# Patient Record
Sex: Female | Born: 1965 | Race: Black or African American | Hispanic: No | Marital: Single | Smoking: Never smoker
Health system: Southern US, Community
[De-identification: ages and names within clinical notes are randomized; demographics above are authoritative.]

## PROBLEM LIST (undated history)

## (undated) DIAGNOSIS — R569 Unspecified convulsions: Secondary | ICD-10-CM

## (undated) DIAGNOSIS — I639 Cerebral infarction, unspecified: Secondary | ICD-10-CM

## (undated) DIAGNOSIS — I10 Essential (primary) hypertension: Secondary | ICD-10-CM

## (undated) HISTORY — DX: Unspecified convulsions: R56.9

---

## 2017-06-13 ENCOUNTER — Inpatient Hospital Stay (HOSPITAL_COMMUNITY)
Admission: EM | Admit: 2017-06-13 | Discharge: 2017-06-17 | DRG: 065 | Disposition: A | Discharge status: Rehabilitation Facility | Payer: Medicare Other | Attending: Internal Medicine | Admitting: Internal Medicine

## 2017-06-13 ENCOUNTER — Emergency Department (HOSPITAL_COMMUNITY): Payer: Medicare Other

## 2017-06-13 ENCOUNTER — Inpatient Hospital Stay (HOSPITAL_COMMUNITY): Payer: Medicare Other

## 2017-06-13 ENCOUNTER — Encounter (HOSPITAL_COMMUNITY): Payer: Self-pay | Admitting: Emergency Medicine

## 2017-06-13 DIAGNOSIS — Z885 Allergy status to narcotic agent status: Secondary | ICD-10-CM | POA: Diagnosis not present

## 2017-06-13 DIAGNOSIS — I69359 Hemiplegia and hemiparesis following cerebral infarction affecting unspecified side: Secondary | ICD-10-CM

## 2017-06-13 DIAGNOSIS — I160 Hypertensive urgency: Secondary | ICD-10-CM | POA: Diagnosis present

## 2017-06-13 DIAGNOSIS — E785 Hyperlipidemia, unspecified: Secondary | ICD-10-CM | POA: Diagnosis present

## 2017-06-13 DIAGNOSIS — I639 Cerebral infarction, unspecified: Secondary | ICD-10-CM | POA: Diagnosis not present

## 2017-06-13 DIAGNOSIS — I69351 Hemiplegia and hemiparesis following cerebral infarction affecting right dominant side: Secondary | ICD-10-CM | POA: Diagnosis present

## 2017-06-13 DIAGNOSIS — G8929 Other chronic pain: Secondary | ICD-10-CM | POA: Diagnosis not present

## 2017-06-13 DIAGNOSIS — R29898 Other symptoms and signs involving the musculoskeletal system: Secondary | ICD-10-CM | POA: Diagnosis present

## 2017-06-13 DIAGNOSIS — R29810 Facial weakness: Secondary | ICD-10-CM | POA: Diagnosis present

## 2017-06-13 DIAGNOSIS — R000 Tachycardia, unspecified: Secondary | ICD-10-CM | POA: Diagnosis not present

## 2017-06-13 DIAGNOSIS — Z791 Long term (current) use of non-steroidal anti-inflammatories (NSAID): Secondary | ICD-10-CM | POA: Diagnosis not present

## 2017-06-13 DIAGNOSIS — M4316 Spondylolisthesis, lumbar region: Secondary | ICD-10-CM | POA: Diagnosis present

## 2017-06-13 DIAGNOSIS — M5117 Intervertebral disc disorders with radiculopathy, lumbosacral region: Secondary | ICD-10-CM | POA: Diagnosis present

## 2017-06-13 DIAGNOSIS — Z8673 Personal history of transient ischemic attack (TIA), and cerebral infarction without residual deficits: Secondary | ICD-10-CM | POA: Diagnosis not present

## 2017-06-13 DIAGNOSIS — I63431 Cerebral infarction due to embolism of right posterior cerebral artery: Secondary | ICD-10-CM | POA: Diagnosis not present

## 2017-06-13 DIAGNOSIS — R29705 NIHSS score 5: Secondary | ICD-10-CM | POA: Diagnosis present

## 2017-06-13 DIAGNOSIS — E669 Obesity, unspecified: Secondary | ICD-10-CM | POA: Diagnosis present

## 2017-06-13 DIAGNOSIS — M5126 Other intervertebral disc displacement, lumbar region: Secondary | ICD-10-CM | POA: Diagnosis present

## 2017-06-13 DIAGNOSIS — I10 Essential (primary) hypertension: Secondary | ICD-10-CM | POA: Diagnosis present

## 2017-06-13 DIAGNOSIS — K5900 Constipation, unspecified: Secondary | ICD-10-CM

## 2017-06-13 DIAGNOSIS — J810 Acute pulmonary edema: Secondary | ICD-10-CM | POA: Diagnosis not present

## 2017-06-13 DIAGNOSIS — M4727 Other spondylosis with radiculopathy, lumbosacral region: Secondary | ICD-10-CM | POA: Diagnosis present

## 2017-06-13 DIAGNOSIS — R471 Dysarthria and anarthria: Secondary | ICD-10-CM | POA: Diagnosis present

## 2017-06-13 DIAGNOSIS — Z888 Allergy status to other drugs, medicaments and biological substances status: Secondary | ICD-10-CM | POA: Diagnosis not present

## 2017-06-13 DIAGNOSIS — D259 Leiomyoma of uterus, unspecified: Secondary | ICD-10-CM | POA: Diagnosis present

## 2017-06-13 DIAGNOSIS — I63512 Cerebral infarction due to unspecified occlusion or stenosis of left middle cerebral artery: Secondary | ICD-10-CM | POA: Diagnosis present

## 2017-06-13 DIAGNOSIS — M4802 Spinal stenosis, cervical region: Secondary | ICD-10-CM | POA: Diagnosis present

## 2017-06-13 DIAGNOSIS — I69322 Dysarthria following cerebral infarction: Secondary | ICD-10-CM | POA: Diagnosis not present

## 2017-06-13 DIAGNOSIS — Z7982 Long term (current) use of aspirin: Secondary | ICD-10-CM

## 2017-06-13 DIAGNOSIS — R7303 Prediabetes: Secondary | ICD-10-CM | POA: Diagnosis not present

## 2017-06-13 DIAGNOSIS — G40909 Epilepsy, unspecified, not intractable, without status epilepticus: Secondary | ICD-10-CM | POA: Diagnosis present

## 2017-06-13 DIAGNOSIS — D72829 Elevated white blood cell count, unspecified: Secondary | ICD-10-CM | POA: Diagnosis present

## 2017-06-13 DIAGNOSIS — Z823 Family history of stroke: Secondary | ICD-10-CM

## 2017-06-13 DIAGNOSIS — Z87898 Personal history of other specified conditions: Secondary | ICD-10-CM

## 2017-06-13 DIAGNOSIS — I6523 Occlusion and stenosis of bilateral carotid arteries: Secondary | ICD-10-CM | POA: Diagnosis present

## 2017-06-13 DIAGNOSIS — F4322 Adjustment disorder with anxiety: Secondary | ICD-10-CM | POA: Diagnosis present

## 2017-06-13 DIAGNOSIS — I728 Aneurysm of other specified arteries: Secondary | ICD-10-CM | POA: Diagnosis present

## 2017-06-13 DIAGNOSIS — M48061 Spinal stenosis, lumbar region without neurogenic claudication: Secondary | ICD-10-CM | POA: Diagnosis present

## 2017-06-13 DIAGNOSIS — I739 Peripheral vascular disease, unspecified: Secondary | ICD-10-CM | POA: Diagnosis present

## 2017-06-13 DIAGNOSIS — I119 Hypertensive heart disease without heart failure: Secondary | ICD-10-CM | POA: Diagnosis present

## 2017-06-13 DIAGNOSIS — J811 Chronic pulmonary edema: Secondary | ICD-10-CM | POA: Diagnosis present

## 2017-06-13 DIAGNOSIS — Z79899 Other long term (current) drug therapy: Secondary | ICD-10-CM

## 2017-06-13 DIAGNOSIS — M5441 Lumbago with sciatica, right side: Secondary | ICD-10-CM | POA: Diagnosis present

## 2017-06-13 DIAGNOSIS — I69311 Memory deficit following cerebral infarction: Secondary | ICD-10-CM | POA: Diagnosis not present

## 2017-06-13 DIAGNOSIS — F5104 Psychophysiologic insomnia: Secondary | ICD-10-CM | POA: Diagnosis present

## 2017-06-13 DIAGNOSIS — M5127 Other intervertebral disc displacement, lumbosacral region: Secondary | ICD-10-CM | POA: Diagnosis present

## 2017-06-13 DIAGNOSIS — R6510 Systemic inflammatory response syndrome (SIRS) of non-infectious origin without acute organ dysfunction: Secondary | ICD-10-CM | POA: Diagnosis not present

## 2017-06-13 DIAGNOSIS — I700 Atherosclerosis of aorta: Secondary | ICD-10-CM | POA: Diagnosis present

## 2017-06-13 DIAGNOSIS — Z6834 Body mass index (BMI) 34.0-34.9, adult: Secondary | ICD-10-CM

## 2017-06-13 DIAGNOSIS — R569 Unspecified convulsions: Secondary | ICD-10-CM | POA: Diagnosis not present

## 2017-06-13 DIAGNOSIS — M5440 Lumbago with sciatica, unspecified side: Secondary | ICD-10-CM | POA: Diagnosis not present

## 2017-06-13 DIAGNOSIS — M79652 Pain in left thigh: Secondary | ICD-10-CM | POA: Diagnosis not present

## 2017-06-13 DIAGNOSIS — F5101 Primary insomnia: Secondary | ICD-10-CM | POA: Diagnosis not present

## 2017-06-13 DIAGNOSIS — M5416 Radiculopathy, lumbar region: Secondary | ICD-10-CM | POA: Diagnosis not present

## 2017-06-13 HISTORY — DX: Cerebral infarction, unspecified: I63.9

## 2017-06-13 HISTORY — DX: Essential (primary) hypertension: I10

## 2017-06-13 LAB — I-STAT TROPONIN, ED: TROPONIN I, POC: 0.01 ng/mL (ref 0.00–0.08)

## 2017-06-13 LAB — DIFFERENTIAL
Basophils Absolute: 0 10*3/uL (ref 0.0–0.1)
Basophils Relative: 0 %
Eosinophils Absolute: 0.3 10*3/uL (ref 0.0–0.7)
Eosinophils Relative: 2 %
LYMPHS ABS: 3.1 10*3/uL (ref 0.7–4.0)
Lymphocytes Relative: 25 %
MONOS PCT: 5 %
Monocytes Absolute: 0.6 10*3/uL (ref 0.1–1.0)
Neutro Abs: 8.2 10*3/uL — ABNORMAL HIGH (ref 1.7–7.7)
Neutrophils Relative %: 68 %

## 2017-06-13 LAB — URINALYSIS, ROUTINE W REFLEX MICROSCOPIC
Bacteria, UA: NONE SEEN
Bilirubin Urine: NEGATIVE
GLUCOSE, UA: NEGATIVE mg/dL
Ketones, ur: NEGATIVE mg/dL
Leukocytes, UA: NEGATIVE
Nitrite: NEGATIVE
PH: 5 (ref 5.0–8.0)
PROTEIN: NEGATIVE mg/dL
Specific Gravity, Urine: 1.008 (ref 1.005–1.030)

## 2017-06-13 LAB — RAPID URINE DRUG SCREEN, HOSP PERFORMED
Amphetamines: NOT DETECTED
BARBITURATES: NOT DETECTED
BENZODIAZEPINES: NOT DETECTED
Cocaine: NOT DETECTED
Opiates: NOT DETECTED
Tetrahydrocannabinol: NOT DETECTED

## 2017-06-13 LAB — COMPREHENSIVE METABOLIC PANEL
ALT: 38 U/L (ref 14–54)
AST: 31 U/L (ref 15–41)
Albumin: 3.9 g/dL (ref 3.5–5.0)
Alkaline Phosphatase: 61 U/L (ref 38–126)
Anion gap: 10 (ref 5–15)
BILIRUBIN TOTAL: 0.4 mg/dL (ref 0.3–1.2)
BUN: 9 mg/dL (ref 6–20)
CHLORIDE: 106 mmol/L (ref 101–111)
CO2: 23 mmol/L (ref 22–32)
CREATININE: 0.62 mg/dL (ref 0.44–1.00)
Calcium: 10.2 mg/dL (ref 8.9–10.3)
GFR calc Af Amer: >60 mL/min (ref >60)
Glucose, Bld: 101 mg/dL — ABNORMAL HIGH (ref 65–99)
Potassium: 4 mmol/L (ref 3.5–5.1)
Sodium: 139 mmol/L (ref 135–145)
TOTAL PROTEIN: 7.6 g/dL (ref 6.5–8.1)

## 2017-06-13 LAB — CBC
HEMATOCRIT: 40.7 % (ref 36.0–46.0)
Hemoglobin: 13 g/dL (ref 12.0–15.0)
MCH: 28.8 pg (ref 26.0–34.0)
MCHC: 31.9 g/dL (ref 30.0–36.0)
MCV: 90 fL (ref 78.0–100.0)
PLATELETS: 303 10*3/uL (ref 150–400)
RBC: 4.52 MIL/uL (ref 3.87–5.11)
RDW: 14.7 % (ref 11.5–15.5)
WBC: 12.2 10*3/uL — ABNORMAL HIGH (ref 4.0–10.5)

## 2017-06-13 LAB — ETHANOL

## 2017-06-13 LAB — PROTIME-INR
INR: 0.94
PROTHROMBIN TIME: 12.5 s (ref 11.4–15.2)

## 2017-06-13 LAB — APTT: aPTT: 29 seconds (ref 24–36)

## 2017-06-13 MED ORDER — ASPIRIN 325 MG PO TABS
325.0000 mg | ORAL_TABLET | Freq: Every day | ORAL | Status: DC
  Administered 2017-06-14: 325 mg via ORAL
  Filled 2017-06-13: qty 1

## 2017-06-13 MED ORDER — HYDROCODONE-ACETAMINOPHEN 5-325 MG PO TABS
1.0000 | ORAL_TABLET | Freq: Four times a day (QID) | ORAL | Status: DC | PRN
  Administered 2017-06-14: 1 via ORAL
  Administered 2017-06-14 – 2017-06-17 (×5): 2 via ORAL
  Filled 2017-06-13: qty 1
  Filled 2017-06-13 (×5): qty 2

## 2017-06-13 MED ORDER — DOCUSATE SODIUM 100 MG PO CAPS
100.0000 mg | ORAL_CAPSULE | Freq: Every day | ORAL | Status: DC
  Administered 2017-06-14 – 2017-06-17 (×4): 100 mg via ORAL
  Filled 2017-06-13 (×4): qty 1

## 2017-06-13 MED ORDER — HYDROCODONE-ACETAMINOPHEN 5-325 MG PO TABS: 1.0000 | ORAL_TABLET | Freq: Four times a day (QID) | ORAL | Status: DC | PRN

## 2017-06-13 MED ORDER — SENNOSIDES-DOCUSATE SODIUM 8.6-50 MG PO TABS
1.0000 | ORAL_TABLET | Freq: Every evening | ORAL | Status: DC | PRN
  Administered 2017-06-14 – 2017-06-16 (×2): 1 via ORAL
  Filled 2017-06-13 (×2): qty 1

## 2017-06-13 MED ORDER — ASPIRIN 300 MG RE SUPP: 300.0000 mg | Freq: Every day | RECTAL | Status: DC

## 2017-06-13 MED ORDER — LAMOTRIGINE 100 MG PO TABS
200.0000 mg | ORAL_TABLET | Freq: Every day | ORAL | Status: DC
  Administered 2017-06-14 – 2017-06-17 (×3): 200 mg via ORAL
  Filled 2017-06-13: qty 1
  Filled 2017-06-13 (×4): qty 2

## 2017-06-13 MED ORDER — IOPAMIDOL (ISOVUE-370) INJECTION 76%
INTRAVENOUS | Status: AC
  Administered 2017-06-14: 50 mL
  Filled 2017-06-13: qty 50

## 2017-06-13 MED ORDER — ASPIRIN EC 325 MG PO TBEC
325.0000 mg | DELAYED_RELEASE_TABLET | Freq: Every day | ORAL | Status: DC
  Administered 2017-06-13: 325 mg via ORAL
  Filled 2017-06-13: qty 1

## 2017-06-13 MED ORDER — ATORVASTATIN CALCIUM 40 MG PO TABS: 40.0000 mg | ORAL_TABLET | Freq: Every day | ORAL | Status: DC

## 2017-06-13 MED ORDER — STROKE: EARLY STAGES OF RECOVERY BOOK
Freq: Once | Status: AC
  Administered 2017-06-14: 01:00:00
  Filled 2017-06-13 (×2): qty 1

## 2017-06-13 MED ORDER — HYDROCODONE-ACETAMINOPHEN 5-325 MG PO TABS
1.0000 | ORAL_TABLET | Freq: Once | ORAL | Status: AC
  Administered 2017-06-13: 1 via ORAL
  Filled 2017-06-13: qty 1

## 2017-06-13 MED ORDER — VITAMIN B-12 1000 MCG PO TABS
1000.0000 ug | ORAL_TABLET | Freq: Every day | ORAL | Status: DC
  Administered 2017-06-14 – 2017-06-17 (×4): 1000 ug via ORAL
  Filled 2017-06-13 (×4): qty 1

## 2017-06-13 MED ORDER — ACETAMINOPHEN 325 MG PO TABS: 650.0000 mg | ORAL_TABLET | ORAL | Status: DC | PRN

## 2017-06-13 MED ORDER — MORPHINE SULFATE (PF) 4 MG/ML IV SOLN
2.0000 mg | INTRAVENOUS | Status: DC | PRN
  Administered 2017-06-13: 2 mg via INTRAVENOUS
  Filled 2017-06-13: qty 1

## 2017-06-13 MED ORDER — ACETAMINOPHEN 160 MG/5ML PO SOLN: 650.0000 mg | ORAL | Status: DC | PRN

## 2017-06-13 MED ORDER — ZOLPIDEM TARTRATE 5 MG PO TABS
5.0000 mg | ORAL_TABLET | Freq: Every day | ORAL | Status: DC
  Administered 2017-06-14 – 2017-06-16 (×4): 5 mg via ORAL
  Filled 2017-06-13 (×4): qty 1

## 2017-06-13 MED ORDER — ACETAMINOPHEN 650 MG RE SUPP: 650.0000 mg | RECTAL | Status: DC | PRN

## 2017-06-13 MED ORDER — ENOXAPARIN SODIUM 40 MG/0.4ML ~~LOC~~ SOLN
40.0000 mg | Freq: Every day | SUBCUTANEOUS | Status: DC
  Administered 2017-06-14 – 2017-06-17 (×4): 40 mg via SUBCUTANEOUS
  Filled 2017-06-13 (×4): qty 0.4

## 2017-06-13 MED ORDER — SODIUM CHLORIDE 0.9 % IV SOLN
INTRAVENOUS | Status: AC
  Administered 2017-06-13: 22:00:00 via INTRAVENOUS

## 2017-06-13 MED ORDER — FERROUS SULFATE 325 (65 FE) MG PO TABS
325.0000 mg | ORAL_TABLET | Freq: Every day | ORAL | Status: DC
  Administered 2017-06-14 – 2017-06-17 (×4): 325 mg via ORAL
  Filled 2017-06-13 (×4): qty 1

## 2017-06-13 NOTE — Progress Notes (Signed)
Pt admitted from ED with stroke diagnosis, pt alert and oriented, a bit weak, c/o of back and left leg pain, pt settled in bed, call light at bedside, tele monitor put and verified on pt, safety issues addressed accordingly, pt reassured and will continue to monitor. Leslie Simmons, [name]

## 2017-06-13 NOTE — ED Notes (Addendum)
Pt given 2mg  morphine. Pt was sitting on the side of the bed. Immediately after morphine administration pt stated she felt faint. Pt then went unresponsive for approx 10 seconds. This RN along with Joellen Jersey, RN were able to get pt back into bed. Pt slow to arouse, but finally able to answer questions appropriately when stimulated. Pt VS remain stable although pt BP is much lower than her normal. Pt lying flat in bed. Dr Myna Hidalgo made aware.

## 2017-06-13 NOTE — Consult Note (Addendum)
Requesting Physician: Dr. Ronnald Ramp    Chief Complaint:  Left leg weakness  History obtained from:  Patient and Chart   HPI:                                                                                                                                       Leslie Simmons is an 51 y.o. female  with PMH significant for hypertension, prior Left MCA stroke ( ?? Hemorrhagic per patient)  with residual right-sided hemiparesis presents to the emergency room for left leg weakness and back pain that began on 8.29.18 evening. Today she was been dragging her leg and decided to come to the emergency room. MRI L-spine was performed in the ER which did not show any significant nerve root compression or stenosis. An MRI brain showed a right midbrain stroke. She has from [place] visiting family.  Regarding her prior strokes, they occurred in 2012 and 2013. On MRI she has old left MCA stroke likely ischemic with no evidence of chronic hemorrhage. She was treated at [facility] ,[place]. They said that the etiology for stroke was hypertension.    Date last known well: 8.29.18 Time last known well: Morning of 8.29.18 tPA Given: Outside window NIHSS : 2       Modified Rankin: 1   History reviewed. No pertinent past medical history.  History reviewed. No pertinent surgical history.  FAMILY History: 16 year old son had multiple strokes  Social History:  reports that she has never smoked. She has never used smokeless tobacco. She reports that she does not drink alcohol or use drugs.  Allergies:  Allergies  Allergen Reactions  . Gabapentin Swelling  . Codeine Rash    Medications:                                                                                                                           Reviewed medication history. Patient is taking ASA  ROS:  General ROS: negative for - chills, fatigue, fever, night sweats, weight gain or weight loss Psychological ROS: negative for - behavioral disorder, hallucinations, memory difficulties, mood swings or suicidal ideation Ophthalmic ROS: negative for - blurry vision, double vision, eye pain or loss of vision ENT ROS: negative for - epistaxis, nasal discharge, oral lesions, sore throat, tinnitus or vertigo Allergy and Immunology ROS: negative for - hives or itchy/watery eyes Hematological and Lymphatic ROS: negative for - bleeding problems, bruising or swollen lymph nodes Endocrine ROS: negative for - galactorrhea, hair pattern changes, polydipsia/polyuria or temperature intolerance Respiratory ROS: negative for - cough, hemoptysis, shortness of breath or wheezing Cardiovascular ROS: negative for - chest pain, dyspnea on exertion, edema or irregular heartbeat Gastrointestinal ROS: negative for - abdominal pain, diarrhea, hematemesis, nausea/vomiting or stool incontinence Genito-Urinary ROS: negative for - dysuria, hematuria, incontinence or urinary frequency/urgency Musculoskeletal ROS: negative for - joint swelling or muscular weakness Neurological ROS: as noted in HPI Dermatological ROS: negative for rash and skin lesion changes   Examination:                                                                                                      General: Appears well-developed and well-nourished.  Psych: Affect appropriate to situation Eyes: No scleral injection HENT: No OP obstrucion Head: Normocephalic.  Cardiovascular: Normal rate and regular rhythm.  Respiratory: Effort normal and breath sounds normal to anterior ascultation GI: Soft.  No distension. There is no tenderness.  Skin: WDI   Neurological Examination Mental Status: Alert, oriented, thought content appropriate.  Speech fluent without evidence of aphasia.  Able to follow 3 step commands without difficulty. Cranial  Nerves: II: Discs flat bilaterally; Visual fields grossly normal,  III,IV, VI: ptosis not present, extra-ocular motions intact bilaterally, pupils equal, round, reactive to light and accommodation V,VII: smile symmetric, facial light touch sensation normal bilaterally VIII: hearing normal bilaterally IX,X: uvula rises symmetrically XI: bilateral shoulder shrug XII: midline tongue extension Motor: Right : Upper extremity   5/5    Left:     Upper extremity   4+/5  Lower extremity   4+/5    Lower extremity   4/5 Tone and bulk:normal tone throughout; no atrophy noted Sensory:  Reduced sensation over left face, arm and leg Deep Tendon Reflexes: 2+ and symmetric throughout Plantars: Right: downgoing   Left: downgoing Cerebellar: normal finger-to-nose, normal rapid alternating movements and normal heel-to-shin test Gait: normal gait and station     Lab Results: Basic Metabolic Panel:  Recent Labs Lab 06/13/17 1144  NA 139  K 4.0  CL 106  CO2 23  GLUCOSE 101*  BUN 9  CREATININE 0.62  CALCIUM 10.2    CBC:  Recent Labs Lab 06/13/17 1144  WBC 12.2*  NEUTROABS 8.2*  HGB 13.0  HCT 40.7  MCV 90.0  PLT 303    Coagulation Studies:  Recent Labs  06/13/17 1144  LABPROT 12.5  INR 0.94    Imaging: Ct Head Wo Contrast  Result Date: 06/13/2017 CLINICAL DATA:  Bilateral lower extremity weakness for 2 days. History of  stroke with residual right-sided weakness. EXAM: CT HEAD WITHOUT CONTRAST TECHNIQUE: Contiguous axial images were obtained from the base of the skull through the vertex without intravenous contrast. COMPARISON:  None. FINDINGS: Brain: There is a moderate-sized chronic left MCA infarct in the parietal lobe. A chronic lateral lenticulostriate infarct is noted involving the anterior limb of the left internal capsule and basal ganglia. There is slight ex vacuo enlargement of the left lateral ventricle. There is no evidence of acute infarct, intracranial hemorrhage,  mass, midline shift, or extra-axial fluid collection. Vascular: Calcified atherosclerosis at the skullbase. No hyperdense vessel. Skull: No fracture or focal osseous lesion. Sinuses/Orbits: Unremarkable orbits. Partially visualized small osteoma in the left maxillary sinus. Clear mastoid air cells. Other: None. IMPRESSION: 1. No evidence of acute intracranial abnormality. 2. Chronic left parietal and basal ganglia infarcts. Electronically Signed   By: Logan Bores M.D.   On: 06/13/2017 10:47   Mr Brain Wo Contrast (neuro Protocol)  Result Date: 06/13/2017 CLINICAL DATA:  Initial evaluation for acute left leg weakness. EXAM: MRI HEAD WITHOUT CONTRAST TECHNIQUE: Multiplanar, multiecho pulse sequences of the brain and surrounding structures were obtained without intravenous contrast. COMPARISON:  Prior CT from earlier the same day. FINDINGS: Brain: Generalized age appropriate cerebral atrophy. Encephalomalacia with gliosis within the posterior left frontoparietal region compatible with remote left MCA territory infarct. Additional remote lacunar type infarcts present within the left basal ganglia. Approximate 1 cm focus of restricted diffusion at the lateral right midbrain at the level the right cortical spinal tract (series 3, image 24), consistent with an acute ischemic infarct. No associated mass effect or hemorrhage. No other evidence for acute or subacute ischemia. No other areas of chronic infarction. No other acute or chronic intracranial hemorrhage. No mass lesion, midline shift or mass effect. No hydrocephalus. No extra-axial fluid collection. Major dural sinuses are grossly patent. Pituitary gland suprasellar region within normal limits. Vascular: Major intracranial vascular flow voids maintained. Skull and upper cervical spine: Craniocervical junction normal. Mild degenerate spondylolysis noted within the upper cervical spine without significant stenosis. Bone marrow signal intensity within normal  limits. No scalp soft tissue abnormality. Sinuses/Orbits: Globes and orbital soft tissues within normal limits. Mild scattered mucosal thickening within the ethmoidal air cells and maxillary sinuses. Paranasal sinuses are otherwise clear. No mastoid effusion. Inner ear structures normal. IMPRESSION: 1. 1 cm acute ischemic nonhemorrhagic infarct involving the lateral right midbrain at the level the right corticospinal tract. No associated mass effect. 2. Remote posterior left MCA territory and left basal ganglia infarcts as above. Electronically Signed   By: Jeannine Boga M.D.   On: 06/13/2017 18:15   Mr Lumbar Spine Wo Contrast  Result Date: 06/13/2017 CLINICAL DATA:  Initial evaluation for acute back pain, leg weakness. EXAM: MRI LUMBAR SPINE WITHOUT CONTRAST TECHNIQUE: Multiplanar, multisequence MR imaging of the lumbar spine was performed. No intravenous contrast was administered. COMPARISON:  None. FINDINGS: Segmentation: Normal segmentation. Lowest well-formed disc labeled the L5-S1 level. Alignment: 4 mm anterolisthesis of L4 on L5. Trace retrolisthesis of L2 on L3 and L3 on L4. Vertebral bodies otherwise normally aligned with preservation of the normal lumbar lordosis. Vertebrae: Vertebral body heights maintained. No evidence for acute or chronic fracture. Signal intensity within the vertebral body bone marrow within normal limits. No discrete or worrisome osseous lesions. No abnormal marrow edema. Conus medullaris: Extends to the L1 level and appears normal. Paraspinal and other soft tissues: Paraspinous soft tissues demonstrate no acute abnormality. Fibroid uterus noted. Partially visualized visceral structures  otherwise unremarkable. Disc levels: Mild diffuse congenital shortening of the pedicles noted. L1-2:  Unremarkable. L2-3: Diffuse degenerative disc bulge with disc desiccation. Superimposed left foraminal/ extraforaminal disc protrusion, contacting the exiting left L2 nerve root as it  courses of the left neural foramen (series 4, image 11). No significant canal stenosis. Mild bilateral L2 foraminal stenosis related to disc bulge and short pedicles. L3-4: Diffuse disc bulge with disc desiccation and mild intervertebral disc space narrowing. Mild facet and ligamentum flavum hypertrophy. Short pedicles. Resultant moderate canal and bilateral subarticular stenosis. Mild to moderate bilateral L3 foraminal narrowing. L4-5: 4 mm anterolisthesis of L4 on L5. Associated diffuse disc bulge. Moderate bilateral facet arthrosis with ligamentum flavum hypertrophy, slightly worse on the right. Short pedicles. Severe canal and bilateral subarticular stenosis. Moderate bilateral L4 foraminal narrowing, right worse than left. L5-S1: Diffuse disc bulge with disc desiccation and intervertebral disc space narrowing. Superimposed left subarticular disc protrusion encroaches upon the left lateral recess, impinging upon the descending left S1 nerve root (series 5, image 25). Mild bilateral facet hypertrophy. Relatively mild canal narrowing. Mild left L5 foraminal stenosis present as well. IMPRESSION: 1. 4 mm anterolisthesis of L4 on L5 with associated disc bulge and moderate facet arthropathy, resulting in severe canal and bilateral subarticular stenosis, with moderate bilateral L4 foraminal narrowing. 2. Left subarticular disc protrusion at L5-S1, impinging upon the descending left S1 nerve root in the left lateral recess. 3. Left foraminal/extraforaminal disc protrusion at L2-3, contacting and potentially irritating the exiting left L2 nerve root. 4. Diffuse congenital shortening of pedicles. 5. Fibroid uterus. Electronically Signed   By: Jeannine Boga M.D.   On: 06/13/2017 18:41     ASSESSMENT AND PLAN   51 y.o. female  with PMH significant for hypertension, prior Left MCA stroke and left basal ganglia infarction with residual right hemiparesis presents with new onset left leg weakness and left  hemisensory symptoms.  MRI brain reveals a acute ischemic stroke in the right midbrain. 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  Risk factors Etiology: HTN, obesity   Recommend #MRA Head and neck  #Transthoracic Echo  # Start patient on ASA 372m daily, Plavix  #Start or continue Atorvastatin 80 mg/other high intensity statin # BP goal: 1001- 1749systolic as patient > 24 hrs,  gradually bring blood pressure down to 140 systoloc # HBAIC and Lipid profile # Telemetry monitoring # Frequent neuro checks # NPO until passes stroke swallow screen # also obtain HIV, RPR, ESR, CRP #PLEASE OBTAIN RECORDS FROM [facility] FOR PRIOR STROKE ADMISSIONS  HTN patient > 24hrs, can gradually bring down BP to normotension    Please page stroke NP  Or  PA  Or MD from 8am -4 pm  as this patient from this time will be  followed by the stroke.   You can look them up on [email or web address]  [facility]  SKarena AddisonAroor MD [facility] [phone] If 7pm to 7am, please call on call as listed on AMION.

## 2017-06-13 NOTE — ED Triage Notes (Signed)
Pt in from home with c/o "stroke-like symptoms". Per husband, pt's LSN was 3pm yesterday. Pt c/o L leg pain from hip down and weakness, denies other L sided weakness. Face symmetrical, hx of CVA with R side deficits, also hx of seizures. A&Ox4

## 2017-06-13 NOTE — ED Notes (Signed)
Patient transported to MRI 

## 2017-06-13 NOTE — Progress Notes (Signed)
Patient ID: Leslie Simmons, female   DOB: [birth year], 51 y.o.   MRN: [id number] I was called about this pt with 3 day h/o L leg weakness and numbness, some chronic back pain. H/o previous L brain stroke with residual R deficits. Strength per ED MD is 4/5 in L leg  MRI Brain shows an acute R midbrain stroke  MRI L-spine shows grade 1 spondy L4-5 with severe canal stenosis, L L5-S1 HNP, mod stenosis L3--4  Her leg weakness could be from either lesion, but I favor the acute midbrain CVA as the cause of acute weakness over chronic lumbar stenosis, even if face and arm are not involved (since it appears to involve the lateral midbrain where I think I recall the leg resides most lateral in the motor homunculus of the peduncle)  I would recommend neurology evaluation and treatment of the acute infarct prior to any surgical intervention on her back, which can be done electively later unless weakness is deemed secondary to spinal stenosis and is found to be progressive and/or if she develops cauda equina. Then more urgent intervention would be warranted. I would not favor urgent surgical lumbar decompression in the face of an acute stroke, as I assume this would greatly increase the risk to her (further neurologic compromise).

## 2017-06-13 NOTE — H&P (Signed)
History and Physical    Leslie Simmons GYI:[id number] DOB: [birth year] DOA: 06/13/2017  PCP: System, Pcp Not In   Patient coming from: Home  Chief Complaint: Left leg weakness  HPI: Leslie Simmons is a 51 y.o. female with medical history significant for hypertension, 2 hemorrhagic strokes with residual right-sided motor deficits, and history of seizure, now presenting to the emergency department for evaluation of left leg weakness and low back pain. Patient reports that she had been in her usual state of health until last night when she noted the development of weakness involving the left leg. She reports that there is also some discomfort in the leg, but mainly in her low back. She has had the low back pain previously, but seems to be worsening. She has persistent right-sided weakness from her old strokes, but the left leg weakness is new as of last night. She denies any fall or trauma, denies saddle anesthesia or incontinence, and denies chest pain or palpitations. There is no headache, change in vision or hearing, or confusion.   ED Course: Upon arrival to the ED, patient is found to be afebrile, saturating well on room air, hypertensive to 175/108, and with vitals otherwise stable. EKG features a normal sinus rhythm and noncontrast head CT is negative for acute intracranial abnormality. Chemistry panels unremarkable, CBC is notable for a mild leukocytosis 12,200, and urinalysis is unremarkable. INR is normal, UDS is negative, and ethanol level is undetectable. MRI brain was performed and reveals a 1 cm acute ischemic nonhemorrhagic infarct involving the lateral right mid brain at the level of the cortical spinal tract. There is no mass effect on MRI. MRI lumbar spine was also performed and notable for severe canal stenosis at L4-5 and moderate stenosis at L3-4. Patient was treated with 325 milligram aspirin in the emergency department and neurosurgery was consulted by the ED physician. Neurosurgery evaluated  patient and recommends neurology consultation, suspecting that the leg weakness is related to the brain lesion. Neurology has been consulted and requests a medical admission. Patient remained hypertensive, but otherwise stable in the ED and will be admitted to the telemetry unit for ongoing evaluation and management of new left leg weakness with acute ischemic stroke on MRI brain and severe lumbar canal stenosis on MRI L-spine.   Review of Systems:  All other systems reviewed and apart from HPI, are negative.  Past Medical History:  Diagnosis Date  . Hypertension   . Stroke [facility])     History reviewed. No pertinent surgical history.   reports that she has never smoked. She has never used smokeless tobacco. She reports that she does not drink alcohol or use drugs.  Allergies  Allergen Reactions  . Gabapentin Swelling  . Codeine Rash    Family History  Problem Relation Age of Onset  . Stroke Son      Prior to Admission medications   Medication Sig Start Date End Date Taking? Authorizing Provider  aspirin EC 81 MG tablet Take 81 mg by mouth daily. 03/06/17  Yes [provider]  atorvastatin (LIPITOR) 10 MG tablet Take 10 mg by mouth at bedtime. 03/06/17  Yes [provider]  Cyanocobalamin (VITAMIN B-12) 1000 MCG SUBL Place 1,000 mcg under the tongue daily. 03/12/17  Yes [provider]  docusate sodium (COLACE) 100 MG capsule Take 100 mg by mouth daily. 02/19/17  Yes [provider]  ferrous sulfate 325 (65 FE) MG tablet Take 325 mg by mouth daily. 04/18/17  Yes [provider]  hydrochlorothiazide (HYDRODIURIL) 25 MG tablet Take 25 mg by mouth every morning. 03/06/17  Yes [provider]  ibuprofen (ADVIL,MOTRIN) 600 MG tablet Take 600 mg by mouth every 8 (eight) hours as needed for pain. 05/28/17  Yes [provider]  lamoTRIgine (LAMICTAL) 100 MG tablet Take 200 mg by mouth daily. 03/06/17  Yes [provider]    metoprolol tartrate (LOPRESSOR) 100 MG tablet Take 100 mg by mouth 2 (two) times daily. 03/06/17  Yes [provider]  valsartan (DIOVAN) 320 MG tablet Take 320 mg by mouth daily. 06/06/17  Yes [provider]  zolpidem (AMBIEN) 10 MG tablet Take 10 mg by mouth at bedtime.   Yes [provider]    Physical Exam: Vitals:   06/13/17 2230 06/13/17 2230 06/13/17 2358 06/14/17 0200  BP: 109/78  140/85 133/80  Pulse: 88  99 92  Resp: 15  18 19   Temp:  98.4 F (36.9 C) 98.2 F (36.8 C)   TempSrc:   Oral   SpO2: 98%  100% 100%  Weight:   80.5 kg (177 lb 7.5 oz)   Height:   5' (1.524 m)       Constitutional: NAD, calm, appears uncomfortable Eyes: PERTLA, lids and conjunctivae normal ENMT: Mucous membranes are moist. Posterior pharynx clear of any exudate or lesions.   Neck: normal, supple, no masses, no thyromegaly Respiratory: clear to auscultation bilaterally, no wheezing, no crackles. Normal respiratory effort. Cardiovascular: S1 & S2 heard, regular rate and rhythm. No extremity edema. No significant JVD. Abdomen: No distension, no tenderness, no masses palpated. Bowel sounds normal.  Musculoskeletal: no clubbing / cyanosis. No joint deformity upper and lower extremities.   Skin: no significant rashes, lesions, ulcers. Warm, dry, well-perfused. Neurologic: No gross facial asymmetry. Strength is 4/5 in RUE, 5/5 in LUE, 4/5 in RLE, and 4/5 in LLE. No appreciable dysarthria or aphasia. Gait not tested.  Psychiatric:  Alert and oriented x 3. Pleasant, cooperative.     Labs on Admission: I have personally reviewed following labs and imaging studies  CBC:  Recent Labs Lab 06/13/17 1144  WBC 12.2*  NEUTROABS 8.2*  HGB 13.0  HCT 40.7  MCV 90.0  PLT 124   Basic Metabolic Panel:  Recent Labs Lab 06/13/17 1144  NA 139  K 4.0  CL 106  CO2 23  GLUCOSE 101*  BUN 9  CREATININE 0.62  CALCIUM 10.2   GFR: Estimated Creatinine Clearance: 77.3 mL/min  (by C-G formula based on SCr of 0.62 mg/dL). Liver Function Tests:  Recent Labs Lab 06/13/17 1144  AST 31  ALT 38  ALKPHOS 61  BILITOT 0.4  PROT 7.6  ALBUMIN 3.9   No results for input(s): LIPASE, AMYLASE in the last 168 hours. No results for input(s): AMMONIA in the last 168 hours. Coagulation Profile:  Recent Labs Lab 06/13/17 1144  INR 0.94   Cardiac Enzymes: No results for input(s): CKTOTAL, CKMB, CKMBINDEX, TROPONINI in the last 168 hours. BNP (last 3 results) No results for input(s): PROBNP in the last 8760 hours. HbA1C: No results for input(s): HGBA1C in the last 72 hours. CBG: No results for input(s): GLUCAP in the last 168 hours. Lipid Profile: No results for input(s): CHOL, HDL, LDLCALC, TRIG, CHOLHDL, LDLDIRECT in the last 72 hours. Thyroid Function Tests: No results for input(s): TSH, T4TOTAL, FREET4, T3FREE, THYROIDAB in the last 72 hours. Anemia Panel: No results for input(s): VITAMINB12, FOLATE, FERRITIN, TIBC, IRON, RETICCTPCT in the last 72 hours. Urine analysis:  Component Value Date/Time   COLORURINE STRAW (A) 06/13/2017 1116   APPEARANCEUR CLEAR 06/13/2017 1116   LABSPEC 1.008 06/13/2017 1116   PHURINE 5.0 06/13/2017 1116   GLUCOSEU NEGATIVE 06/13/2017 1116   HGBUR MODERATE (A) 06/13/2017 1116   BILIRUBINUR NEGATIVE 06/13/2017 [place] 06/13/2017 1116   PROTEINUR NEGATIVE 06/13/2017 1116   NITRITE NEGATIVE 06/13/2017 1116   LEUKOCYTESUR NEGATIVE 06/13/2017 1116   Sepsis Labs: @LABRCNTIP (procalcitonin:4,lacticidven:4) )No results found for this or any previous visit (from the past 240 hour(s)).   Radiological Exams on Admission: Ct Head Wo Contrast  Result Date: 06/13/2017 CLINICAL DATA:  Bilateral lower extremity weakness for 2 days. History of stroke with residual right-sided weakness. EXAM: CT HEAD WITHOUT CONTRAST TECHNIQUE: Contiguous axial images were obtained from the base of the skull through the vertex without  intravenous contrast. COMPARISON:  None. FINDINGS: Brain: There is a moderate-sized chronic left MCA infarct in the parietal lobe. A chronic lateral lenticulostriate infarct is noted involving the anterior limb of the left internal capsule and basal ganglia. There is slight ex vacuo enlargement of the left lateral ventricle. There is no evidence of acute infarct, intracranial hemorrhage, mass, midline shift, or extra-axial fluid collection. Vascular: Calcified atherosclerosis at the skullbase. No hyperdense vessel. Skull: No fracture or focal osseous lesion. Sinuses/Orbits: Unremarkable orbits. Partially visualized small osteoma in the left maxillary sinus. Clear mastoid air cells. Other: None. IMPRESSION: 1. No evidence of acute intracranial abnormality. 2. Chronic left parietal and basal ganglia infarcts. Electronically Signed   By: Logan Bores M.D.   On: 06/13/2017 10:47   Mr Brain Wo Contrast (neuro Protocol)  Result Date: 06/13/2017 CLINICAL DATA:  Initial evaluation for acute left leg weakness. EXAM: MRI HEAD WITHOUT CONTRAST TECHNIQUE: Multiplanar, multiecho pulse sequences of the brain and surrounding structures were obtained without intravenous contrast. COMPARISON:  Prior CT from earlier the same day. FINDINGS: Brain: Generalized age appropriate cerebral atrophy. Encephalomalacia with gliosis within the posterior left frontoparietal region compatible with remote left MCA territory infarct. Additional remote lacunar type infarcts present within the left basal ganglia. Approximate 1 cm focus of restricted diffusion at the lateral right midbrain at the level the right cortical spinal tract (series 3, image 24), consistent with an acute ischemic infarct. No associated mass effect or hemorrhage. No other evidence for acute or subacute ischemia. No other areas of chronic infarction. No other acute or chronic intracranial hemorrhage. No mass lesion, midline shift or mass effect. No hydrocephalus. No  extra-axial fluid collection. Major dural sinuses are grossly patent. Pituitary gland suprasellar region within normal limits. Vascular: Major intracranial vascular flow voids maintained. Skull and upper cervical spine: Craniocervical junction normal. Mild degenerate spondylolysis noted within the upper cervical spine without significant stenosis. Bone marrow signal intensity within normal limits. No scalp soft tissue abnormality. Sinuses/Orbits: Globes and orbital soft tissues within normal limits. Mild scattered mucosal thickening within the ethmoidal air cells and maxillary sinuses. Paranasal sinuses are otherwise clear. No mastoid effusion. Inner ear structures normal. IMPRESSION: 1. 1 cm acute ischemic nonhemorrhagic infarct involving the lateral right midbrain at the level the right corticospinal tract. No associated mass effect. 2. Remote posterior left MCA territory and left basal ganglia infarcts as above. Electronically Signed   By: Jeannine Boga M.D.   On: 06/13/2017 18:15   Mr Lumbar Spine Wo Contrast  Result Date: 06/13/2017 CLINICAL DATA:  Initial evaluation for acute back pain, leg weakness. EXAM: MRI LUMBAR SPINE WITHOUT CONTRAST TECHNIQUE: Multiplanar, multisequence MR imaging of  the lumbar spine was performed. No intravenous contrast was administered. COMPARISON:  None. FINDINGS: Segmentation: Normal segmentation. Lowest well-formed disc labeled the L5-S1 level. Alignment: 4 mm anterolisthesis of L4 on L5. Trace retrolisthesis of L2 on L3 and L3 on L4. Vertebral bodies otherwise normally aligned with preservation of the normal lumbar lordosis. Vertebrae: Vertebral body heights maintained. No evidence for acute or chronic fracture. Signal intensity within the vertebral body bone marrow within normal limits. No discrete or worrisome osseous lesions. No abnormal marrow edema. Conus medullaris: Extends to the L1 level and appears normal. Paraspinal and other soft tissues: Paraspinous soft  tissues demonstrate no acute abnormality. Fibroid uterus noted. Partially visualized visceral structures otherwise unremarkable. Disc levels: Mild diffuse congenital shortening of the pedicles noted. L1-2:  Unremarkable. L2-3: Diffuse degenerative disc bulge with disc desiccation. Superimposed left foraminal/ extraforaminal disc protrusion, contacting the exiting left L2 nerve root as it courses of the left neural foramen (series 4, image 11). No significant canal stenosis. Mild bilateral L2 foraminal stenosis related to disc bulge and short pedicles. L3-4: Diffuse disc bulge with disc desiccation and mild intervertebral disc space narrowing. Mild facet and ligamentum flavum hypertrophy. Short pedicles. Resultant moderate canal and bilateral subarticular stenosis. Mild to moderate bilateral L3 foraminal narrowing. L4-5: 4 mm anterolisthesis of L4 on L5. Associated diffuse disc bulge. Moderate bilateral facet arthrosis with ligamentum flavum hypertrophy, slightly worse on the right. Short pedicles. Severe canal and bilateral subarticular stenosis. Moderate bilateral L4 foraminal narrowing, right worse than left. L5-S1: Diffuse disc bulge with disc desiccation and intervertebral disc space narrowing. Superimposed left subarticular disc protrusion encroaches upon the left lateral recess, impinging upon the descending left S1 nerve root (series 5, image 25). Mild bilateral facet hypertrophy. Relatively mild canal narrowing. Mild left L5 foraminal stenosis present as well. IMPRESSION: 1. 4 mm anterolisthesis of L4 on L5 with associated disc bulge and moderate facet arthropathy, resulting in severe canal and bilateral subarticular stenosis, with moderate bilateral L4 foraminal narrowing. 2. Left subarticular disc protrusion at L5-S1, impinging upon the descending left S1 nerve root in the left lateral recess. 3. Left foraminal/extraforaminal disc protrusion at L2-3, contacting and potentially irritating the exiting left  L2 nerve root. 4. Diffuse congenital shortening of pedicles. 5. Fibroid uterus. Electronically Signed   By: Jeannine Boga M.D.   On: 06/13/2017 18:41   Mr Jodene Nam Head Wo Contrast  Result Date: 06/13/2017 CLINICAL DATA:  Follow-up acute midbrain infarct. Recent unsteady gait. History of strokes. EXAM: MRA HEAD WITHOUT CONTRAST TECHNIQUE: Angiographic images of the Circle of Willis were obtained using MRA technique without intravenous contrast. COMPARISON:  MRI of the head June 13, 2017 at 1719 hours. FINDINGS: ANTERIOR CIRCULATION: Normal flow related enhancement of the included cervical, petrous, cavernous and supraclinoid internal carotid arteries. 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 Patent fenestrated anterior communicating artery. Patent anterior cerebral arteries, including distal segments. Occluded LEFT proximal M2 segment with paucity of mid to distal LEFT middle cerebral artery's. Severe stenosis proximal RIGHT M2 segment. Moderate luminal regularity proximal RIGHT middle cerebral artery's. POSTERIOR CIRCULATION: LEFT vertebral artery is dominant. Basilar artery is patent, with normal flow related enhancement of the main branch vessels. Occluded RIGHT proximal P2 segment with thready collaterals. Small bilateral posterior communicating artery is present. Severe stenosis proximal LEFT P3 segment. No large vessel occlusion, high-grade stenosis,  aneurysm. ANATOMIC VARIANTS: None. Source images and MIP images were reviewed. IMPRESSION: 1. Occluded RIGHT  posterior cerebral artery at P2 origin, mild collateralization. 2. Occluded LEFT middle cerebral artery at M2 origin with poor collateralization. 3. Severe stenosis proximal RIGHT M2 segment and proximal LEFT P3 segment. 4. Focal irregularity and probable stenosis RIGHT supraclinoid internal carotid artery. 5. 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 6. Recommend CTA for further characterization. Acute findings discussed with and reconfirmed by Dr.TIMOTHY OPYD on 06/13/2017 at 11:50 pm. Electronically Signed   By: Elon Alas M.D.   On: 06/13/2017 23:50    EKG: Independently reviewed. Normal sinus rhythm.   Assessment/Plan  1. Acute ischemic CVA  - Pt presents with left leg weakness that began the night prior  - Head CT was negative, but MRI brain reveals an acute 1 cm ischemic infarct in right mid-brain - Neurology is consulting and much appreciated  - Plan to continue cardiac monitoring, obtain MRA, echocardiogram, A1c, fasting lipids, PT/OT/SLP evals  - Hold antihypertensives and treat with prn agents if SBP >210  - Start ASA 325, increase Lipitor to 80 mg   2. Lumbar spinal stenosis - MRI L-spine with severe canal stenosis at L4-5, moderate stenosis at L3-4, and herniated disc at L5-S1  - Neurosurgery is consulting and much appreciated, suspects LLE weakness secondary to the acute stroke  - No change in bowel or bladder function, and no saddle anesthesia - Continue supportive care with prn analgesia   3. Hypertension with hypertensive urgency  - BP elevated to 175/105 in ED  - Likely secondary to acute ischemic CVA  - Managed at home with Lopressor, valsartan, and HCTZ  - Plan to hold her home antihypertensives in acute-phase of stroke, treat as needed for SBP >210    4. Seizure hx  - Continue Lamictal    DVT prophylaxis: sq Lovenox Code Status: Full  Family Communication: Discussed with patient Disposition Plan: Admit to telemetry Consults called: Neurology Admission status: Inpatient    Vianne Bulls, MD [facility] Pager [phone]  If 7PM-7AM, please contact night-coverage [email or web address] Password [email or web address]  06/14/2017, 2:27 AM

## 2017-06-13 NOTE — ED Notes (Signed)
Pt remains at MRI

## 2017-06-13 NOTE — ED Provider Notes (Signed)
[name] DEPT Provider Note   CSN: [id number] Arrival date & time: 06/13/17  0918     History   Chief Complaint No chief complaint on file.   HPI Leslie Simmons is a 51 y.o. female.  The history is provided by the patient. No language interpreter was used.   Leslie Simmons is a 52 y.o. female who presents to the Emergency Department complaining of stroke symptoms.  She has a history of hemorrhagic stroke 2 back in 2000 09/23/2012. She has been having left leg weakness and discomfort since last night. Last seen normal around 3:00. Her husband states that she has been acting not quite at her baseline with gazing off into space at times. She does endorse low back pain that has been ongoing for "a while". Her left leg discomfort has been ongoing for several days but the numbness and weakness is new. She is unsteady with walking. Due to her prior stroke she does have some ongoing right sided weakness. The left lower extremity symptoms are new. She denies any headache, fevers, chest pain, shortness of breath, recent illnesses. She does have a history of seizure disorder but denies any recent seizures. She is not on any anticoagulation. She has been compliant with her medications. No past medical history on file.  There are no active problems to display for this patient.   No past surgical history on file.  OB History    No data available       Home Medications    Prior to Admission medications   Not on File    Family History No family history on file.  Social History Social History  Substance Use Topics  . Smoking status: Not on file  . Smokeless tobacco: Not on file  . Alcohol use Not on file     Allergies   Patient has no allergy information on record.   Review of Systems Review of Systems  All other systems reviewed and are negative.    Physical Exam Updated Vital Signs BP (!) 175/108 (BP Location: Right Arm)   Pulse 74   Temp 98.9 F (37.2 C) (Oral)   Resp  20   SpO2 100%   Physical Exam  Constitutional: She is oriented to person, place, and time. She appears well-developed and well-nourished.  HENT:  Head: Normocephalic and atraumatic.  Cardiovascular: Normal rate and regular rhythm.   No murmur heard. Pulmonary/Chest: Effort normal and breath sounds normal. No respiratory distress.  Abdominal: Soft. There is no tenderness. There is no rebound and no guarding.  Musculoskeletal: She exhibits no edema or tenderness.  Neurological: She is alert and oriented to person, place, and time.  Cranial nerves grossly intact. No pronator drift. There is 4 out of 5 strength of the left lower extremity with altered sensation to light touch in the left lower extremity  Skin: Skin is warm and dry.  Psychiatric: She has a normal mood and affect. Her behavior is normal.  Nursing note and vitals reviewed.    ED Treatments / Results  Labs (all labs ordered are listed, but only abnormal results are displayed) Labs Reviewed  ETHANOL  PROTIME-INR  APTT  CBC  DIFFERENTIAL  COMPREHENSIVE METABOLIC PANEL  RAPID URINE DRUG SCREEN, HOSP PERFORMED  URINALYSIS, ROUTINE W REFLEX MICROSCOPIC  I-STAT TROPONIN, ED    EKG  EKG Interpretation None       Radiology No results found.  Procedures Procedures (including critical care time)  Medications Ordered in ED Medications  HYDROcodone-acetaminophen (NORCO/VICODIN)  5-325 MG per tablet 1 tablet (not administered)     Initial Impression / Assessment and Plan / ED Course  I have reviewed the triage vital signs and the nursing notes.  Pertinent labs & imaging results that were available during my care of the patient were reviewed by me and considered in my medical decision making (see chart for details).     Pt here for evaluation of left lower extremity weakness, has a history of prior CVA, with chronic RLE weakness, now with new onset weakness to LLE with gait difficulties.  Plan to obtain MRI of  brain as well as lumbar spine to further evaluate her symptoms. Patient care transferred pending MRI.  Final Clinical Impressions(s) / ED Diagnoses   Final diagnoses:  None    New Prescriptions New Prescriptions   No medications on file     Quintella Reichert, MD 06/13/17 1714

## 2017-06-13 NOTE — ED Notes (Signed)
Dr Myna Hidalgo at bedside assessing pt per [facility] charge RN request. Per MD, pt still qualifies for Tele bed. Will cont to monitor.

## 2017-06-14 ENCOUNTER — Inpatient Hospital Stay (HOSPITAL_COMMUNITY): Payer: Medicare Other

## 2017-06-14 ENCOUNTER — Encounter (HOSPITAL_COMMUNITY): Payer: Self-pay | Admitting: Family Medicine

## 2017-06-14 DIAGNOSIS — E785 Hyperlipidemia, unspecified: Secondary | ICD-10-CM

## 2017-06-14 DIAGNOSIS — I639 Cerebral infarction, unspecified: Secondary | ICD-10-CM

## 2017-06-14 DIAGNOSIS — J810 Acute pulmonary edema: Secondary | ICD-10-CM

## 2017-06-14 DIAGNOSIS — Z8673 Personal history of transient ischemic attack (TIA), and cerebral infarction without residual deficits: Secondary | ICD-10-CM

## 2017-06-14 LAB — LIPID PANEL
CHOL/HDL RATIO: 3.3 ratio
CHOLESTEROL: 141 mg/dL (ref 0–200)
HDL: 43 mg/dL (ref >40)
LDL Cholesterol: 83 mg/dL (ref 0–99)
Triglycerides: 74 mg/dL (ref <150)
VLDL: 15 mg/dL (ref 0–40)

## 2017-06-14 LAB — HIV ANTIBODY (ROUTINE TESTING W REFLEX): HIV Screen 4th Generation wRfx: NONREACTIVE

## 2017-06-14 LAB — HEMOGLOBIN A1C
Hgb A1c MFr Bld: 5.9 % — ABNORMAL HIGH (ref 4.8–5.6)
Mean Plasma Glucose: 122.63 mg/dL

## 2017-06-14 LAB — COMPREHENSIVE METABOLIC PANEL
ALT: 32 U/L (ref 14–54)
ANION GAP: 8 (ref 5–15)
AST: 26 U/L (ref 15–41)
Albumin: 3.4 g/dL — ABNORMAL LOW (ref 3.5–5.0)
Alkaline Phosphatase: 52 U/L (ref 38–126)
BUN: 9 mg/dL (ref 6–20)
CO2: 23 mmol/L (ref 22–32)
Calcium: 9.2 mg/dL (ref 8.9–10.3)
Chloride: 110 mmol/L (ref 101–111)
Creatinine, Ser: 0.56 mg/dL (ref 0.44–1.00)
GFR calc Af Amer: >60 mL/min (ref >60)
GFR calc non Af Amer: >60 mL/min (ref >60)
Glucose, Bld: 101 mg/dL — ABNORMAL HIGH (ref 65–99)
POTASSIUM: 3.6 mmol/L (ref 3.5–5.1)
Sodium: 141 mmol/L (ref 135–145)
TOTAL PROTEIN: 6.8 g/dL (ref 6.5–8.1)
Total Bilirubin: 0.5 mg/dL (ref 0.3–1.2)

## 2017-06-14 LAB — CBC WITH DIFFERENTIAL/PLATELET
Basophils Absolute: 0 10*3/uL (ref 0.0–0.1)
Basophils Relative: 0 %
Eosinophils Absolute: 0.1 10*3/uL (ref 0.0–0.7)
Eosinophils Relative: 1 %
HEMATOCRIT: 37.8 % (ref 36.0–46.0)
Hemoglobin: 12.1 g/dL (ref 12.0–15.0)
LYMPHS ABS: 3.3 10*3/uL (ref 0.7–4.0)
Lymphocytes Relative: 27 %
MCH: 28.7 pg (ref 26.0–34.0)
MCHC: 32 g/dL (ref 30.0–36.0)
MCV: 89.8 fL (ref 78.0–100.0)
MONOS PCT: 5 %
Monocytes Absolute: 0.7 10*3/uL (ref 0.1–1.0)
Neutro Abs: 8 10*3/uL — ABNORMAL HIGH (ref 1.7–7.7)
Neutrophils Relative %: 67 %
Platelets: 280 10*3/uL (ref 150–400)
RBC: 4.21 MIL/uL (ref 3.87–5.11)
RDW: 15 % (ref 11.5–15.5)
WBC: 12.1 10*3/uL — ABNORMAL HIGH (ref 4.0–10.5)

## 2017-06-14 LAB — ECHOCARDIOGRAM COMPLETE
HEIGHTINCHES: 60 in
Weight: 2839.52 oz

## 2017-06-14 LAB — MAGNESIUM: Magnesium: 1.7 mg/dL (ref 1.7–2.4)

## 2017-06-14 LAB — C-REACTIVE PROTEIN: CRP: <0.8 mg/dL (ref <1.0)

## 2017-06-14 LAB — SEDIMENTATION RATE: SED RATE: 12 mm/h (ref 0–22)

## 2017-06-14 LAB — PHOSPHORUS: Phosphorus: 3.9 mg/dL (ref 2.5–4.6)

## 2017-06-14 MED ORDER — ATORVASTATIN CALCIUM 80 MG PO TABS
80.0000 mg | ORAL_TABLET | Freq: Every day | ORAL | Status: DC
  Administered 2017-06-14 – 2017-06-16 (×3): 80 mg via ORAL
  Filled 2017-06-14 (×3): qty 1

## 2017-06-14 MED ORDER — LABETALOL HCL 5 MG/ML IV SOLN: 5.0000 mg | INTRAVENOUS | Status: DC | PRN

## 2017-06-14 MED ORDER — ASPIRIN EC 325 MG PO TBEC
325.0000 mg | DELAYED_RELEASE_TABLET | Freq: Every day | ORAL | Status: DC
  Administered 2017-06-15 – 2017-06-17 (×3): 325 mg via ORAL
  Filled 2017-06-14 (×3): qty 1

## 2017-06-14 MED ORDER — CLOPIDOGREL BISULFATE 75 MG PO TABS
75.0000 mg | ORAL_TABLET | Freq: Every day | ORAL | Status: DC
  Administered 2017-06-14 – 2017-06-17 (×4): 75 mg via ORAL
  Filled 2017-06-14 (×4): qty 1

## 2017-06-14 NOTE — Progress Notes (Signed)
Rehab Admissions Coordinator Note:  Patient was screened by Cleatrice Burke for appropriateness for an Inpatient Acute Rehab Consult per OT recommendation..  At this time, we are recommending Inpatient Rehab consult. I will alert MD to request consult.  Cleatrice Burke 06/14/2017, 12:24 PM  I can be reached at [phone].

## 2017-06-14 NOTE — Consult Note (Signed)
Reason for Consult: left leg weakness, acute stroke and lumbar stenosis Referring Physician: Dr. Leda Quail is an 51 y.o. female.   HPI:  51 year old female comes in with new onset left lower extremity weakness. She states that she has had chronic back pain for several years now. She has a history of hypertension and a stroke in 2012 which left her with residual right sided weakness. She states that over the last couple days she has developed weakness and pain in her left lower extremity. Patient says that her leg has been giving out from under her a couple times. Her pain radiates from her left lower back to the posterior aspect of her left leg into the bottom of her foot. Denies any pain in the right leg. She does have some numbness and tingling in the bottom of her left foot. Denies any change in her bowel or bladder habits. Denies any headaches or vision changes. Her pain is a 10/10 and constant. She has been taking ibuprofen and tylenol at home which are not helping.    Past Medical History:  Diagnosis Date  . Hypertension   . Stroke [facility])     History reviewed. No pertinent surgical history.  Allergies  Allergen Reactions  . Gabapentin Swelling  . Codeine Rash    Social History  Substance Use Topics  . Smoking status: Never Smoker  . Smokeless tobacco: Never Used  . Alcohol use No    Family History  Problem Relation Age of Onset  . Stroke Son      Review of Systems  Positive ROS: left leg weakness and pain down the posterior aspect to the bottom of her foot.   All other systems have been reviewed and were otherwise negative with the exception of those mentioned in the HPI and as above.  Objective: Vital signs in last 24 hours: Temp:  [98.2 F (36.8 C)-98.9 F (37.2 C)] 98.2 F (36.8 C) (08/30 2358) Pulse Rate:  [51-115] 100 (08/31 0600) Resp:  [11-20] 20 (08/31 0600) BP: (97-175)/(66-110) 148/94 (08/31 0600) SpO2:  [94 %-100 %] 98 % (08/31 0600) Weight:  [77.6  kg (171 lb)-80.5 kg (177 lb 7.5 oz)] 80.5 kg (177 lb 7.5 oz) (08/30 2358)  General Appearance: Alert, cooperative, no distress, appears stated age Head: Normocephalic, without obvious abnormality, atraumatic Eyes: PERRL, conjunctiva/corneas clear, EOM's intact, fundi benign, both eyes      Ears: Not tested Throat: not tested Neck: Supple, symmetrical, trachea midline, no adenopathy; thyroid: No enlargement/tenderness/nodules; no carotid bruit or JVD Back: Symmetric, no curvature, ROM normal Lungs:  respirations unlabored Heart: Regular rate and rhythm Abdomen: not tested Extremities: Extremities normal, atraumatic, no cyanosis or edema Pulses: 2+ and symmetric all extremities Skin: Skin color, texture, turgor normal, no rashes or lesions  NEUROLOGIC:   Mental status: A&O x4, no aphasia, good attention span, Memory and fund of knowledge Motor Exam - grossly normal, normal tone and bulk, LLE 4/5 strength, LUE 4/5 strength Sensory Exam - grossly normal Reflexes: symmetric, no pathologic reflexes, No Hoffman's, No clonus Coordination - grossly normal Gait - grossly normal Balance - grossly normal Cranial Nerves: I: smell Not tested  II: visual acuity  OS: NA   OD: NA  II: visual fields Full to confrontation  II: pupils Equal, round, reactive to light  III,VII: ptosis None  III,IV,VI: extraocular muscles  Full ROM  V: mastication Normal  V: facial light touch sensation  Normal  V,VII: corneal reflex  Present  VII: facial muscle function - upper  Normal  VII: facial muscle function - lower Normal  VIII: hearing Not tested  IX: soft palate elevation  Normal  IX,X: gag reflex Present  XI: trapezius strength  5/5  XI: sternocleidomastoid strength 5/5  XI: neck flexion strength  5/5  XII: tongue strength  Normal    Data Review Lab Results  Component Value Date   WBC 12.2 (H) 06/13/2017   HGB 13.0 06/13/2017   HCT 40.7 06/13/2017   MCV 90.0 06/13/2017   PLT 303 06/13/2017    Lab Results  Component Value Date   NA 139 06/13/2017   K 4.0 06/13/2017   CL 106 06/13/2017   CO2 23 06/13/2017   BUN 9 06/13/2017   CREATININE 0.62 06/13/2017   GLUCOSE 101 (H) 06/13/2017   Lab Results  Component Value Date   INR 0.94 06/13/2017    Radiology: Ct Angio Head W Or Wo Contrast  Result Date: 06/14/2017 CLINICAL DATA:  Follow-up stroke and cerebral artery occlusion. EXAM: CT ANGIOGRAPHY HEAD AND NECK TECHNIQUE: Multidetector CT imaging of the head and neck was performed using the standard protocol during bolus administration of intravenous contrast. Multiplanar CT image reconstructions and MIPs were obtained to evaluate the vascular anatomy. Carotid stenosis measurements (when applicable) are obtained utilizing NASCET criteria, using the distal internal carotid diameter as the denominator. CONTRAST:  50 cc Isovue 370 COMPARISON:  MRI and MRA head June 13, 2017 FINDINGS: CTA NECK AORTIC ARCH: Normal appearance of the thoracic arch, 2 vessel arch is a normal variant. Mild calcific atherosclerosis aortic arch. Intimal thickening resulting in moderate stenosis RIGHT subclavian artery origin. RIGHT CAROTID SYSTEM: Common carotid artery is widely patent, retropharyngeal course. Mild eccentric calcific atherosclerosis RIGHT carotid bifurcation without hemodynamically significant stenosis by NASCET criteria. RIGHT tonsillar loop. LEFT CAROTID SYSTEM: Common carotid artery is widely patent, coursing in a straight line fashion. Retropharyngeal course. Mild eccentric intimal thickening without hemodynamically significant stenosis by NASCET criteria. VERTEBRAL ARTERIES:Left vertebral artery is dominant. Patent bilateral vertebral artery's, mild extrinsic deformity due to degenerative cervical spine. SKELETON: No acute osseous process though bone windows have not been submitted. OTHER NECK: Soft tissues of the neck are nonacute though, not tailored for evaluation. Reversed cervical lordosis  with severe C5-6 and C6-7 degenerative discs, moderate at C4-5. Severe canal stenosis C5-6. Moderate to severe C5-6 and C6-7 neural foraminal narrowing. UPPER CHEST: Included lung apices are clear. No superior mediastinal lymphadenopathy. Mild suspected cardiomegaly with hazy ground-glass opacities and pulmonary vascular congestion. CTA HEAD ANTERIOR CIRCULATION: Calcific atherosclerosis resulting in severe stenosis RIGHT anterior genu of the internal carotid artery to paraophthalmic segment. 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 Moderate stenosis LEFT supraclinoid internal carotid artery. Thready RIGHT A1 segment with moderate luminal irregularity LEFT A1 segment. Patent anterior communicating artery. Bilateral anterior cerebral artery's are patent. Occluded LEFT M2 origin, superior division. Thready inferior division with poor collateralization in pruning. Moderate stenosis RIGHT M1 segment. Severe stenosis proximal RIGHT M2 segment. No large vessel occlusion, significant stenosis, contrast extravasation or aneurysm. POSTERIOR CIRCULATION: Patent vertebral arteries, vertebrobasilar junction and basilar artery, as well as main branch vessels. Occluded RIGHT proximal P2 segment with mild collateralization. Severe stenosis LEFT P3 origin. No large vessel occlusion, significant stenosis, contrast extravasation or aneurysm. VENOUS SINUSES: Major dural venous sinuses are patent though not tailored for evaluation on this angiographic examination. ANATOMIC VARIANTS: None. DELAYED PHASE: No abnormal intracranial enhancement. MIP images reviewed. IMPRESSION: CTA NECK: 1. Atherosclerosis without  hemodynamically significant stenosis or acute vascular process in the neck. 2. Suspected cardiomegaly and pulmonary edema. Recommend chest radiograph. 3. Degenerative cervical spine resulting in severe canal stenosis C5-6. Moderate to severe C5-6 and C6-7 neural foraminal narrowing. CTA HEAD: 1.  Severe stenosis RIGHT internal carotid artery at anterior genu/supraclinoid segment due to advanced atherosclerosis. 2. 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 3. Occluded RIGHT PCA at proximal P2 segment, poor collateralization and, likely acute given recent MRI findings. 4. Severe stenosis RIGHT M2 and LEFT P3 segments. Moderate stenosis RIGHT M1 segment. 5. 2 mm RIGHT PCOM origin aneurysm. Aortic Atherosclerosis (ICD10-I70.0). Electronically Signed   By: Elon Alas M.D.   On: 06/14/2017 02:28   Ct Head Wo Contrast  Result Date: 06/13/2017 CLINICAL DATA:  Bilateral lower extremity weakness for 2 days. History of stroke with residual right-sided weakness. EXAM: CT HEAD WITHOUT CONTRAST TECHNIQUE: Contiguous axial images were obtained from the base of the skull through the vertex without intravenous contrast. COMPARISON:  None. FINDINGS: Brain: There is a moderate-sized chronic left MCA infarct in the parietal lobe. A chronic lateral lenticulostriate infarct is noted involving the anterior limb of the left internal capsule and basal ganglia. There is slight ex vacuo enlargement of the left lateral ventricle. There is no evidence of acute infarct, intracranial hemorrhage, mass, midline shift, or extra-axial fluid collection. Vascular: Calcified atherosclerosis at the skullbase. No hyperdense vessel. Skull: No fracture or focal osseous lesion. Sinuses/Orbits: Unremarkable orbits. Partially visualized small osteoma in the left maxillary sinus. Clear mastoid air cells. Other: None. IMPRESSION: 1. No evidence of acute intracranial abnormality. 2. Chronic left parietal and basal ganglia infarcts. Electronically Signed   By: Logan Bores M.D.   On: 06/13/2017 10:47   Ct Angio Neck W Or Wo Contrast  Result Date: 06/14/2017 CLINICAL DATA:  Follow-up stroke and cerebral artery occlusion. EXAM: CT ANGIOGRAPHY HEAD AND NECK TECHNIQUE: Multidetector CT imaging of the head and neck was performed using the  standard protocol during bolus administration of intravenous contrast. Multiplanar CT image reconstructions and MIPs were obtained to evaluate the vascular anatomy. Carotid stenosis measurements (when applicable) are obtained utilizing NASCET criteria, using the distal internal carotid diameter as the denominator. CONTRAST:  50 cc Isovue 370 COMPARISON:  MRI and MRA head June 13, 2017 FINDINGS: CTA NECK AORTIC ARCH: Normal appearance of the thoracic arch, 2 vessel arch is a normal variant. Mild calcific atherosclerosis aortic arch. Intimal thickening resulting in moderate stenosis RIGHT subclavian artery origin. RIGHT CAROTID SYSTEM: Common carotid artery is widely patent, retropharyngeal course. Mild eccentric calcific atherosclerosis RIGHT carotid bifurcation without hemodynamically significant stenosis by NASCET criteria. RIGHT tonsillar loop. LEFT CAROTID SYSTEM: Common carotid artery is widely patent, coursing in a straight line fashion. Retropharyngeal course. Mild eccentric intimal thickening without hemodynamically significant stenosis by NASCET criteria. VERTEBRAL ARTERIES:Left vertebral artery is dominant. Patent bilateral vertebral artery's, mild extrinsic deformity due to degenerative cervical spine. SKELETON: No acute osseous process though bone windows have not been submitted. OTHER NECK: Soft tissues of the neck are nonacute though, not tailored for evaluation. Reversed cervical lordosis with severe C5-6 and C6-7 degenerative discs, moderate at C4-5. Severe canal stenosis C5-6. Moderate to severe C5-6 and C6-7 neural foraminal narrowing. UPPER CHEST: Included lung apices are clear. No superior mediastinal lymphadenopathy. Mild suspected cardiomegaly with hazy ground-glass opacities and pulmonary vascular congestion. CTA HEAD ANTERIOR CIRCULATION: Calcific atherosclerosis resulting in severe stenosis RIGHT anterior genu of the internal carotid artery to paraophthalmic segment. 2 mm inferiorly  directed aneurysm at posterior communicating artery origin. Moderate stenosis LEFT supraclinoid internal carotid artery. Thready RIGHT A1 segment with moderate luminal irregularity LEFT A1 segment. Patent anterior communicating artery. Bilateral anterior cerebral artery's are patent. Occluded LEFT M2 origin, superior division. Thready inferior division with poor collateralization in pruning. Moderate stenosis RIGHT M1 segment. Severe stenosis proximal RIGHT M2 segment. No large vessel occlusion, significant stenosis, contrast extravasation or aneurysm. POSTERIOR CIRCULATION: Patent vertebral arteries, vertebrobasilar junction and basilar artery, as well as main branch vessels. Occluded RIGHT proximal P2 segment with mild collateralization. Severe stenosis LEFT P3 origin. No large vessel occlusion, significant stenosis, contrast extravasation or aneurysm. VENOUS SINUSES: Major dural venous sinuses are patent though not tailored for evaluation on this angiographic examination. ANATOMIC VARIANTS: None. DELAYED PHASE: No abnormal intracranial enhancement. MIP images reviewed. IMPRESSION: CTA NECK: 1. Atherosclerosis without hemodynamically significant stenosis or acute vascular process in the neck. 2. Suspected cardiomegaly and pulmonary edema. Recommend chest radiograph. 3. Degenerative cervical spine resulting in severe canal stenosis C5-6. Moderate to severe C5-6 and C6-7 neural foraminal narrowing. CTA HEAD: 1. Severe stenosis RIGHT internal carotid artery at anterior genu/supraclinoid segment due to advanced atherosclerosis. 2. 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 3. Occluded RIGHT PCA at proximal P2 segment, poor collateralization and, likely acute given recent MRI findings. 4. Severe stenosis RIGHT M2 and LEFT P3 segments. Moderate stenosis RIGHT M1 segment. 5. 2 mm RIGHT PCOM origin aneurysm. Aortic Atherosclerosis (ICD10-I70.0). Electronically Signed   By: Elon Alas M.D.   On: 06/14/2017  02:28   Mr Brain Wo Contrast (neuro Protocol)  Result Date: 06/13/2017 CLINICAL DATA:  Initial evaluation for acute left leg weakness. EXAM: MRI HEAD WITHOUT CONTRAST TECHNIQUE: Multiplanar, multiecho pulse sequences of the brain and surrounding structures were obtained without intravenous contrast. COMPARISON:  Prior CT from earlier the same day. FINDINGS: Brain: Generalized age appropriate cerebral atrophy. Encephalomalacia with gliosis within the posterior left frontoparietal region compatible with remote left MCA territory infarct. Additional remote lacunar type infarcts present within the left basal ganglia. Approximate 1 cm focus of restricted diffusion at the lateral right midbrain at the level the right cortical spinal tract (series 3, image 24), consistent with an acute ischemic infarct. No associated mass effect or hemorrhage. No other evidence for acute or subacute ischemia. No other areas of chronic infarction. No other acute or chronic intracranial hemorrhage. No mass lesion, midline shift or mass effect. No hydrocephalus. No extra-axial fluid collection. Major dural sinuses are grossly patent. Pituitary gland suprasellar region within normal limits. Vascular: Major intracranial vascular flow voids maintained. Skull and upper cervical spine: Craniocervical junction normal. Mild degenerate spondylolysis noted within the upper cervical spine without significant stenosis. Bone marrow signal intensity within normal limits. No scalp soft tissue abnormality. Sinuses/Orbits: Globes and orbital soft tissues within normal limits. Mild scattered mucosal thickening within the ethmoidal air cells and maxillary sinuses. Paranasal sinuses are otherwise clear. No mastoid effusion. Inner ear structures normal. IMPRESSION: 1. 1 cm acute ischemic nonhemorrhagic infarct involving the lateral right midbrain at the level the right corticospinal tract. No associated mass effect. 2. Remote posterior left MCA territory and  left basal ganglia infarcts as above. Electronically Signed   By: Jeannine Boga M.D.   On: 06/13/2017 18:15   Mr Lumbar Spine Wo Contrast  Result Date: 06/13/2017 CLINICAL DATA:  Initial evaluation for acute back pain, leg weakness. EXAM: MRI LUMBAR SPINE WITHOUT CONTRAST TECHNIQUE: Multiplanar, multisequence MR imaging of the lumbar spine was performed. No intravenous contrast  was administered. COMPARISON:  None. FINDINGS: Segmentation: Normal segmentation. Lowest well-formed disc labeled the L5-S1 level. Alignment: 4 mm anterolisthesis of L4 on L5. Trace retrolisthesis of L2 on L3 and L3 on L4. Vertebral bodies otherwise normally aligned with preservation of the normal lumbar lordosis. Vertebrae: Vertebral body heights maintained. No evidence for acute or chronic fracture. Signal intensity within the vertebral body bone marrow within normal limits. No discrete or worrisome osseous lesions. No abnormal marrow edema. Conus medullaris: Extends to the L1 level and appears normal. Paraspinal and other soft tissues: Paraspinous soft tissues demonstrate no acute abnormality. Fibroid uterus noted. Partially visualized visceral structures otherwise unremarkable. Disc levels: Mild diffuse congenital shortening of the pedicles noted. L1-2:  Unremarkable. L2-3: Diffuse degenerative disc bulge with disc desiccation. Superimposed left foraminal/ extraforaminal disc protrusion, contacting the exiting left L2 nerve root as it courses of the left neural foramen (series 4, image 11). No significant canal stenosis. Mild bilateral L2 foraminal stenosis related to disc bulge and short pedicles. L3-4: Diffuse disc bulge with disc desiccation and mild intervertebral disc space narrowing. Mild facet and ligamentum flavum hypertrophy. Short pedicles. Resultant moderate canal and bilateral subarticular stenosis. Mild to moderate bilateral L3 foraminal narrowing. L4-5: 4 mm anterolisthesis of L4 on L5. Associated diffuse disc  bulge. Moderate bilateral facet arthrosis with ligamentum flavum hypertrophy, slightly worse on the right. Short pedicles. Severe canal and bilateral subarticular stenosis. Moderate bilateral L4 foraminal narrowing, right worse than left. L5-S1: Diffuse disc bulge with disc desiccation and intervertebral disc space narrowing. Superimposed left subarticular disc protrusion encroaches upon the left lateral recess, impinging upon the descending left S1 nerve root (series 5, image 25). Mild bilateral facet hypertrophy. Relatively mild canal narrowing. Mild left L5 foraminal stenosis present as well. IMPRESSION: 1. 4 mm anterolisthesis of L4 on L5 with associated disc bulge and moderate facet arthropathy, resulting in severe canal and bilateral subarticular stenosis, with moderate bilateral L4 foraminal narrowing. 2. Left subarticular disc protrusion at L5-S1, impinging upon the descending left S1 nerve root in the left lateral recess. 3. Left foraminal/extraforaminal disc protrusion at L2-3, contacting and potentially irritating the exiting left L2 nerve root. 4. Diffuse congenital shortening of pedicles. 5. Fibroid uterus. Electronically Signed   By: Jeannine Boga M.D.   On: 06/13/2017 18:41   Dg Chest Port 1 View  Result Date: 06/14/2017 CLINICAL DATA:  Acute ischemic stroke.  Hypertension. EXAM: PORTABLE CHEST 1 VIEW COMPARISON:  None. FINDINGS: The heart size and mediastinal contours are within normal limits. Both lungs are clear. The visualized skeletal structures are unremarkable. IMPRESSION: No active disease. Electronically Signed   By: Earle Gell M.D.   On: 06/14/2017 07:43   Mr Jodene Nam Head Wo Contrast  Result Date: 06/13/2017 CLINICAL DATA:  Follow-up acute midbrain infarct. Recent unsteady gait. History of strokes. EXAM: MRA HEAD WITHOUT CONTRAST TECHNIQUE: Angiographic images of the Circle of Willis were obtained using MRA technique without intravenous contrast. COMPARISON:  MRI of the head  June 13, 2017 at 1719 hours. FINDINGS: ANTERIOR CIRCULATION: Normal flow related enhancement of the included cervical, petrous, cavernous and supraclinoid internal carotid arteries. 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 Patent fenestrated anterior communicating artery. Patent anterior cerebral arteries, including distal segments. Occluded LEFT proximal M2 segment with paucity of mid to distal LEFT middle cerebral artery's. Severe stenosis proximal RIGHT M2 segment. Moderate luminal regularity proximal RIGHT middle cerebral artery's. POSTERIOR CIRCULATION: LEFT vertebral artery is  dominant. Basilar artery is patent, with normal flow related enhancement of the main branch vessels. Occluded RIGHT proximal P2 segment with thready collaterals. Small bilateral posterior communicating artery is present. Severe stenosis proximal LEFT P3 segment. No large vessel occlusion, high-grade stenosis,  aneurysm. ANATOMIC VARIANTS: None. Source images and MIP images were reviewed. IMPRESSION: 1. 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 2. Occluded LEFT middle cerebral artery at M2 origin with poor collateralization. 3. Severe stenosis proximal RIGHT M2 segment and proximal LEFT P3 segment. 4. Focal irregularity and probable stenosis RIGHT supraclinoid internal carotid artery. 5. 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 6. Recommend CTA for further characterization. Acute findings discussed with and reconfirmed by Dr.TIMOTHY OPYD on 06/13/2017 at 11:50 pm. Electronically Signed   By: Elon Alas M.D.   On: 06/13/2017 23:50     Assessment/Plan: Pleasant 51 year old female with a new onset left leg weakness and pain. Chronic back pain that she has not sought treatment for. MRI of lumbar showed severe stenosis at L4-5 with grade 1 spondylolisthesis, L5-S1  herniated disc, and moderate stenosis at L3-4. Rule out whether her leg weakness is from acute stroke vs lumbar stenosis. No intervention needed at this time from a NS as her strength in her left leg is still a 4/5 and she denies any incontinence issues.    Ocie Cornfield Preston Weill 06/14/2017 9:21 AM

## 2017-06-14 NOTE — Evaluation (Signed)
Physical Therapy Evaluation Patient Details Name: Leslie Simmons MRN: [id number] DOB: 13-Aug-1966 Today's Date: 06/14/2017   History of Present Illness  51 y.o. female  with PMH significant for hypertension, prior Left MCA stroke ( ?? Hemorrhagic per patient)  with residual right-sided hemiparesis presents to the emergency room for left leg weakness and back pain that began on 8.29.18 evening.MRI brain showed a right midbrain stroke. MRI of lumbar showed severe stenosis at L4-5 with grade 1 spondylolisthesis, L5-S1 herniated disc, and moderate stenosis at L3-4.   Clinical Impression  Pt was seen for assessment of her BP in all postures, as well as strength of LLE and mobility.  Her supine BP was 132/92 pulse 105, sitting was 149/99 pulse 111, standing BP was 139/114 pulse 123.  Her plan is to request CIR admission and progress her mobility to allow a return home with family support.  Pt has come her from [place] and has daughter in [place] and has significant other in [place] now.  Plan to follow acutely for mobility with LLE strengthening and monitoring of vitals esp BP and pulse.  O2 sats were at 98% at rest.    Follow Up Recommendations CIR    Equipment Recommendations  None recommended by PT    Recommendations for Other Services Rehab consult     Precautions / Restrictions Precautions Precautions: Fall Precaution Comments: ck BP and pulses Restrictions Weight Bearing Restrictions: No      Mobility  Bed Mobility Overal bed mobility: Needs Assistance Bed Mobility: Supine to Sit;Sit to Supine     Supine to sit: Min assist;Mod assist (min to lift trunk then mod to finish scooting to EOB) Sit to supine: Mod assist;Max assist Sit to sidelying: Mod assist General bed mobility comments: LLE needed assist to get onto bed but could lift RLE  Transfers Overall transfer level: Needs assistance Equipment used: Rolling walker (2 wheeled);1 person hand held assist Transfers: Sit to/from  Stand Sit to Stand: Min assist         General transfer comment: assisted to power up and control balance at walker  Ambulation/Gait Ambulation/Gait assistance: Min assist;Min guard Ambulation Distance (Feet): 6 Feet Assistive device: Rolling walker (2 wheeled);1 person hand held assist Gait Pattern/deviations: Step-to pattern;Narrow base of support;Trunk flexed;Shuffle;Decreased stride length Gait velocity: reduced Gait velocity interpretation: Below normal speed for age/gender General Gait Details: decreased LLE control from weak quads and ankle  Stairs            Wheelchair Mobility    Modified Rankin (Stroke Patients Only) Modified Rankin (Stroke Patients Only) Pre-Morbid Rankin Score: Slight disability Modified Rankin: Moderately severe disability     Balance Overall balance assessment: Needs assistance Sitting-balance support: Bilateral upper extremity supported;Feet supported Sitting balance-Leahy Scale: Fair       Standing balance-Leahy Scale: Poor Standing balance comment: heavy reliance on RW                             Pertinent Vitals/Pain Pain Assessment: Faces Faces Pain Scale: Hurts whole lot Pain Location: low back Pain Descriptors / Indicators: Sore Pain Intervention(s): Limited activity within patient's tolerance;Monitored during session;Premedicated before session;Repositioned    Home Living Family/patient expects to be discharged to:: Private residence Living Arrangements: Spouse/significant other;Children Available Help at Discharge: Family;Available PRN/intermittently Type of Home: House Home Access: Level entry (per pt)     Home Layout: One level Home Equipment: Walker - 4 wheels Additional Comments: pt lives in [place]  Prior Function Level of Independence: Independent               Hand Dominance        Extremity/Trunk Assessment   Upper Extremity Assessment Upper Extremity Assessment: Defer to OT  evaluation    Lower Extremity Assessment Lower Extremity Assessment: LLE deficits/detail LLE Deficits / Details: Strength in L knee and ankle were low, 3- quads and 2 DF, hip 2+ and has difficulty using LLE to take a step LLE Sensation: decreased light touch LLE Coordination: decreased fine motor;decreased gross motor    Cervical / Trunk Assessment Cervical / Trunk Assessment: Normal Cervical / Trunk Exceptions: painful back. limited trunk flexion due to pain  Communication   Communication: No difficulties  Cognition Arousal/Alertness: Awake/alert Behavior During Therapy: Flat affect Overall Cognitive Status: Within Functional Limits for tasks assessed                                 General Comments: Pt follows instructions with extra time      General Comments      Exercises     Assessment/Plan    PT Assessment Patient needs continued PT services  PT Problem List Decreased strength;Decreased range of motion;Decreased activity tolerance;Decreased balance;Decreased mobility;Decreased coordination;Decreased knowledge of use of DME;Decreased cognition;Decreased safety awareness;Cardiopulmonary status limiting activity;Decreased skin integrity       PT Treatment Interventions DME instruction;Gait training;Stair training;Functional mobility training;Therapeutic activities;Therapeutic exercise;Neuromuscular re-education;Balance training;Patient/family education    PT Goals (Current goals can be found in the Care Plan section)  Acute Rehab PT Goals Patient Stated Goal: get stronger and get home PT Goal Formulation: With patient Time For Goal Achievement: 06/28/17 Potential to Achieve Goals: Good    Frequency Min 3X/week   Barriers to discharge Other (comment) (will need assistance for all mobility now) rehab advised due to her level of weakness    Co-evaluation               AM-PAC PT "6 Clicks" Daily Activity  Outcome Measure Difficulty turning  over in bed (including adjusting bedclothes, sheets and blankets)?: Unable Difficulty moving from lying on back to sitting on the side of the bed? : Unable Difficulty sitting down on and standing up from a chair with arms (e.g., wheelchair, bedside commode, etc,.)?: Unable Help needed moving to and from a bed to chair (including a wheelchair)?: A Lot Help needed walking in hospital room?: A Lot Help needed climbing 3-5 steps with a railing? : Total 6 Click Score: 8    End of Session Equipment Utilized During Treatment: Gait belt Activity Tolerance: Patient limited by fatigue;Treatment limited secondary to medical complications (Comment) (elevated BP with all effort, progressively higher c more eff) Patient left: in bed;with call bell/phone within reach;with bed alarm set Nurse Communication: Mobility status PT Visit Diagnosis: Unsteadiness on feet (R26.81);Repeated falls (R29.6)    Time: 0100-7121 PT Time Calculation (min) (ACUTE ONLY): 35 min   Charges:   PT Evaluation $PT Eval Moderate Complexity: 1 Mod PT Treatments $Therapeutic Activity: 8-22 mins   PT G Codes:   PT G-Codes **NOT FOR INPATIENT CLASS** Functional Assessment Tool Used: AM-PAC 6 Clicks Basic Mobility    Ramond Dial 06/14/2017, 3:52 PM   Mee Hives, PT MS Acute Rehab Dept. Number: [place] [facility] [place]

## 2017-06-14 NOTE — Progress Notes (Signed)
STROKE TEAM PROGRESS NOTE   HISTORY OF PRESENT ILLNESS (per record) Leslie Simmons is an 51 y.o. female  with PMH significant for cocaine abuse (quit 2008, utox neg), hypertension, Bell's Palsy, chronic back pain, uterine fibroids, and a stroke in 2012 and 2013, with residual right-sided hemiparesis presented to the emergency room for left leg weakness and back pain that began on the evening of 05/23/2017. Today she was been dragging her leg and decided to come to the emergency room. MRI L-spine was performed in the ER which did not show any significant nerve root compression or stenosis. An MRI brain showed a right midbrain stroke. She has from [place] visiting family.  Her prior strokes occurred in 2012 and 2013. On MRI she has an old left MCA stroke likely ischemic with no evidence of chronic hemorrhage. She was treated at [facility], [place]. She says they told her that the etiology for stroke was hypertension.   Date last known well: 8.29.18 Time last known well: Morning of 8.29.18 NIHSS : 2                                                                     Modified Rankin: 1   Patient was not administered IV t-PA secondary to arriving outside of the tPA treatment window. She was admitted to General neurology for further evaluation and treatment.   SUBJECTIVE (INTERVAL HISTORY) Her husband and daughter are at the bedside.  The patient is awake, alert, and follows all commands appropriately.  The pt reports good BP control at home. Pt had hx of bell's palsy on the left and stroke x2 in 2012 and 2013 left with right hand mild weakness.  The pt's son has had three strokes and is 60 years old.  Pt states she ran out of lipitor, and has not been taking them.  Pt states she takes ASA.  L-sided facial droop.  Pt is relocating from [place] to [place].   OBJECTIVE Temp:  [97.9 F (36.6 C)-98.8 F (37.1 C)] 97.9 F (36.6 C) (08/31 0926) Pulse Rate:  [51-115] 85 (08/31 0926) Cardiac  Rhythm: Normal sinus rhythm (08/31 0924) Resp:  [11-20] 18 (08/31 0926) BP: (97-167)/(66-110) 147/98 (08/31 0926) SpO2:  [94 %-100 %] 99 % (08/31 0926) Weight:  [80.5 kg (177 lb 7.5 oz)] 80.5 kg (177 lb 7.5 oz) (08/30 2358)  CBC:  Recent Labs Lab 06/13/17 1144 06/14/17 0913  WBC 12.2* 12.1*  NEUTROABS 8.2* 8.0*  HGB 13.0 12.1  HCT 40.7 37.8  MCV 90.0 89.8  PLT 303 546    Basic Metabolic Panel:  Recent Labs Lab 06/13/17 1144 06/14/17 0913  NA 139 141  K 4.0 3.6  CL 106 110  CO2 23 23  GLUCOSE 101* 101*  BUN 9 9  CREATININE 0.62 0.56  CALCIUM 10.2 9.2  MG  --  1.7  PHOS  --  3.9    Lipid Panel:    Component Value Date/Time   CHOL 141 06/14/2017 0534   TRIG 74 06/14/2017 0534   HDL 43 06/14/2017 0534   CHOLHDL 3.3 06/14/2017 0534   VLDL 15 06/14/2017 0534   LDLCALC 83 06/14/2017 0534   HgbA1c:  Lab Results  Component Value Date   HGBA1C 5.9 (H) 06/14/2017  Urine Drug Screen:    Component Value Date/Time   LABOPIA NONE DETECTED 06/13/2017 1116   COCAINSCRNUR NONE DETECTED 06/13/2017 1116   LABBENZ NONE DETECTED 06/13/2017 1116   AMPHETMU NONE DETECTED 06/13/2017 1116   THCU NONE DETECTED 06/13/2017 1116   LABBARB NONE DETECTED 06/13/2017 1116    Alcohol Level     Component Value Date/Time   ETH <5 06/13/2017 1144    IMAGING I have personally reviewed the radiological images below and agree with the radiology interpretations.  Ct Head Wo Contrast 06/13/2017 IMPRESSION: 1. No evidence of acute intracranial abnormality. 2. Chronic left parietal and basal ganglia infarcts.  Ct Angio Head W Or Wo Contrast Ct Angio Neck W Or Wo Contrast 06/14/2017 IMPRESSION: CTA NECK: 1. Atherosclerosis without hemodynamically significant stenosis or acute vascular process in the neck. 2. Suspected cardiomegaly and pulmonary edema. Recommend chest radiograph. 3. Degenerative cervical spine resulting in severe canal stenosis C5-6. Moderate to severe C5-6 and C6-7 neural  foraminal narrowing. CTA HEAD: 1. Severe stenosis RIGHT internal carotid artery at anterior genu/supraclinoid segment due to advanced atherosclerosis. 2. 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 3. Occluded RIGHT PCA at proximal P2 segment, poor collateralization and, likely acute given recent MRI findings. 4. Severe stenosis RIGHT M2 and LEFT P3 segments. Moderate stenosis RIGHT M1 segment. 5. 2 mm RIGHT PCOM origin aneurysm. Aortic Atherosclerosis (ICD10-I70.0).   Mr Brain Wo Contrast (neuro Protocol) 06/13/2017 IMPRESSION: 1. 1 cm acute ischemic nonhemorrhagic infarct involving the lateral right midbrain at the level the right corticospinal tract. No associated mass effect. 2. Remote posterior left MCA territory and left basal ganglia infarcts as above.   Mr Lumbar Spine Wo Contrast 06/13/2017 IMPRESSION: 1. 4 mm anterolisthesis of L4 on L5 with associated disc bulge and moderate facet arthropathy, resulting in severe canal and bilateral subarticular stenosis, with moderate bilateral L4 foraminal narrowing. 2. Left subarticular disc protrusion at L5-S1, impinging upon the descending left S1 nerve root in the left lateral recess. 3. Left foraminal/extraforaminal disc protrusion at L2-3, contacting and potentially irritating the exiting left L2 nerve root. 4. Diffuse congenital shortening of pedicles. 5. Fibroid uterus.   Mr Jodene Nam Head Wo Contrast 06/13/2017 IMPRESSION: 1. 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 2. Occluded LEFT middle cerebral artery at M2 origin with poor collateralization. 3. Severe stenosis proximal RIGHT M2 segment and proximal LEFT P3 segment. 4. Focal irregularity and probable stenosis RIGHT supraclinoid internal carotid artery. 5. 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 6. Recommend CTA for further characterization.  Dg Chest Port 1 View 06/14/2017 IMPRESSION: No active disease.  TTE 06/14/2017 - Left ventricle:  The cavity size was normal. Wall thickness was   increased in a pattern of mild LVH. Systolic function was   vigorous. The estimated ejection fraction was in the range of 65%   to 70%.   PHYSICAL EXAM  Temp:  [97.9 F (36.6 C)-98.8 F (37.1 C)] 98.8 F (37.1 C) (08/31 2121) Pulse Rate:  [75-101] 101 (08/31 2121) Resp:  [11-20] 20 (08/31 2121) BP: (109-161)/(73-98) 135/78 (08/31 2121) SpO2:  [96 %-100 %] 100 % (08/31 2121) Weight:  [177 lb 7.5 oz (80.5 kg)] 177 lb 7.5 oz (80.5 kg) (08/30 2358)  General - Well nourished, well developed, in no apparent distress.  Ophthalmologic - Sharp disc margins OU.  Cardiovascular - Regular rate and rhythm.  Mental Status -  Level of arousal and orientation to time, place, and person were intact. Language including  expression, naming, repetition, comprehension was assessed and found intact. Fund of Knowledge was assessed and was intact.  Cranial Nerves II - XII - II - Visual field intact OU. III, IV, VI - Extraocular movements intact. V - Facial sensation intact bilaterally. VII - left nasolabial fold flattening. VIII - Hearing & vestibular intact bilaterally. X - Palate elevates symmetrically. XI - Chin turning & shoulder shrug intact bilaterally. XII - Tongue protrusion intact.  Motor Strength - The patient's strength was normal in all extremities except left hand dexterity difficulty and left LE 4/5 and pronator drift was absent.  Bulk was normal and fasciculations were absent.   Motor Tone - Muscle tone was assessed at the neck and appendages and was normal.  Reflexes - The patient's reflexes were 1+ in all extremities and she had no pathological reflexes.  Sensory - Light touch, temperature/pinprick were assessed and were decreased on the right, 80% of the left.    Coordination - The patient had normal movements in the hands with no ataxia or dysmetria.  Tremor was absent.  Gait and Station - deferred.  ASSESSMENT/PLAN Ms.  Leslie Simmons is a 51 y.o. female with history of hypertension, prior Left MCA stroke ( ?? Hemorrhagic per patient) with residual right-sided hemiparesis presented to the emergency room for left leg weakness and back pain that began on 8./9/18 evening. She did not receive IV t-PA due to arriving outside of the tPA treatment window.  Stroke:  Small right midbrain infarct, location consistent with small vessel disease. However, pt lack of significant stroke risk factors. Pt had hx of left MCA strokes in young and FHx of stroke, need to rule out hypercoagulable state  Resultant  Mild left hemiparesis  CT head: no acute stroke  MRI head: small lateral right midbrain at the level the right corticospinal tract.  MRA head: occluded L M2 and R P2 segments; severely stenotic R ICA siphon and left M1  CTA head and neck - occluded L M2 and R P2 segments; severely stenotic R ICA siphon and left M1  2D Echo  EF 65-70%  Hypercoagulable panel pending (ESR and CRP normal)  LDL 83  HgbA1c 5.3  UDS negative  SCDs for VTE prophylaxis  Diet NPO time specified Except for: Ice Chips, Sips with Meds  aspirin 81 mg daily prior to admission, now on aspirin 325 mg daily and clopidogrel 75 mg daily. Continue DAPT for 3 months and then plavix alone due to intracranial stenosis.  Patient counseled to be compliant with her antithrombotic medications  Ongoing aggressive stroke risk factor management  Therapy recommendations:  CIR; Supervision/Assistance - 24 hour   Disposition:  pending   Hx of stroke  2012 and 2013  MRI showed chronic left MCA cortical infarcts  Etiology not sure  Son has stroke at 16 yo.   Hypercoagulable pending  Intracranial stenosis  CTA and MRA - occluded L M2 and R P2 segments; severely stenotic R ICA siphon and left M1  Pt lack of significant stroke risk factors  Primary CNS vasculitis is in DDx, however, pt no HA or cognitive impairment, vessel involvement belongs large  vessel instead of moderate or small sized vessels  Continue DAPT for 3 months and then plavix alone.   Hypertension  Stable  Permissive hypertension (OK if < 220/120) but gradually normalize in 5-7 days  Long-term BP goal normotensive  Hyperlipidemia  Home meds:  Atorvastatin (pt ran out and is not taking)  LDL 83, goal < 70  Add atorvastatin 80 mg PO daily  Continue statin at discharge  Other Stroke Risk Factors  Obesity, Body mass index is 34.66 kg/m., recommend weight loss, diet and exercise as appropriate   Other Active Problems  Hx of left bell's palsy - recovered fully  Hospital day # 1  Neurology will sign off. Please call with questions. Pt will follow up with Dr. Erlinda Hong at [facility] in about 6 weeks. Thanks for the consult.  Rosalin Hawking, MD PhD Stroke Neurology 06/14/2017 10:27 PM    To contact Stroke Continuity provider, please refer to [email or web address] After hours, contact General Neurology

## 2017-06-14 NOTE — Progress Notes (Signed)
PROGRESS NOTE    Leslie Simmons  QQV:[id number] DOB: October 07, 1966 DOA: 06/13/2017 PCP: System, Pcp Not In   Brief Narrative:  Leslie Simmons is a 51 y.o. female with medical history significant for hypertension, 2 hemorrhagic strokes with residual right-sided motor deficits, and history of seizures and other comorbids now presenting to the emergency department for evaluation of left leg weakness and low back pain. Patient reports that she had been in her usual state of health until last night when she noted the development of weakness involving the left leg. She reports that there is also some discomfort in the leg, but mainly in her low back. She has had the low back pain previously, but seems to be worsening. She has persistent right-sided weakness from her old strokes, but the left leg weakness is new as of last night. She denies any fall or trauma, denies saddle anesthesia or incontinence, and denies chest pain or palpitations. There is no headache, change in vision or hearing, or confusion.   MRI brain was performed and reveals a 1 cm acute ischemic nonhemorrhagic infarct involving the lateral right mid brain at the level of the cortical spinal tract. There is no mass effect on MRI. MRI lumbar spine was also performed and notable for severe canal stenosis at L4-5 and moderate stenosis at L3-4. Patient was treated with 325 milligram aspirin in the emergency department and neurosurgery was consulted by the ED physician. Neurosurgery evaluated patient and recommends Neurology consultation, suspecting that the leg weakness is related to the brain lesion. Neurology has been consulted and requests a medical admission. Patient was admitted for new left leg weakness with acute ischemic stroke on MRI brain and severe lumbar canal stenosis on MRI L-spine. Stroke workup in Progress.   Assessment & Plan:   Principal Problem:   Acute ischemic stroke [facility]) Active Problems:   Lumbar spinal stenosis   Hypertensive  urgency   Essential hypertension   History of hemorrhagic stroke with residual hemiparesis (HCC)   History of seizure   Left leg weakness  1. Acute Ischemic CVA of the Lateral Midbrain  - Pt presents with left leg weakness that began the night prior  - Head CT showed No evidence of acute intracranial abnormality. Chronic left parietal and basal ganglia infarcts. - MRI brain showed 1 cm acute ischemic nonhemorrhagic infarct involving the lateral right midbrain at the level the right corticospinal tract. No associated mass effect. Remote posterior left MCA territory and left basal ganglia infarcts as above. - MRA Head w/o Contrast showed Occluded RIGHT posterior cerebral artery at P2 origin, mild collateralization. Occluded LEFT middle cerebral artery at M2 origin with poor Collateralization. Severe stenosis proximal RIGHT M2 segment and proximal LEFT P3 Segment. Focal irregularity and probable stenosis RIGHT supraclinoid internal carotid artery. 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 - CTA Head showed Severe stenosis RIGHT internal carotid artery at anterior genu/supraclinoid segment due to advanced atherosclerosis. 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 Occluded RIGHT PCA at proximal P2 segment, poor collateralization and, likely acute given recent MRI findings. Severe stenosis RIGHT M2 and LEFT P3 segments. Moderate stenosis RIGHT M1 segment. 2 mm RIGHT PCOM origin aneurysm. - CTA Neck showed Atherosclerosis without hemodynamically significant stenosis or acute vascular process in the neck. Suspected cardiomegaly and pulmonary edema. Degenerative cervical spine resulting in severe canal stenosis C5-6. Moderate to severe C5-6 and C6-7 neural foraminal narrowing - ECHOCardiogram done and pending - Neurology is consulting and much appreciated  - Plan to  continue cardiac monitoring - Lipid Panel showed Cholesterol of 141, HDL of 43, LDL of 83, TG of 74, VLDL 15 -  Hemoglobin A1c was 5.9 - Hold antihypertensives and treat with prn agents if SBP >210  - C/w ASA 325 mg po Daily, Clopidogrel 75 mg po daily and Atorvastatin 80 mg po Daily - Obtain PT/OT/SLP - Neurology ordering Hypercoaguable Workup  - PT Eval pending; OT recommending CIR - CIR consulted   2. Low Back Pain and Lumbar Spinal Stenosis - MRI L-spine with severe canal stenosis at L4-5, moderate stenosis at L3-4, and herniated disc at L5-S1  - Neurosurgery is consulting and much appreciated, suspects LLE weakness secondary to the acute stroke  - No change in bowel or bladder function, and no saddle anesthesia - Continue supportive care with prn analgesia  - Neurosurgery recommending Therapy for Strengthening and Ambulation - Will need Elective Decompression and Instrument Fusion at L4-5 with possible decompression at L3-4 and L5-S1 once over acute CVA  3. Hypertension with hypertensive urgency  - BP elevated to 175/105 in ED  - Likely secondary to acute ischemic CVA  - Managed at home with Lopressor, Valsartan, and HCTZ  - Plan to hold her home antihypertensives in acute-phase of stroke, treat as needed for SBP >210   - C/w Labetalol 5 mg IV q2hprn for SBP >210 - Allow for permissive HTN  4. Seizure Hx  - Continue Lamotrigine 200 mg po Daily  5. ?Cardiomegaly and Pulmonary Edema -Seen on CTA of Neck -CXR this AM showed The heart size and mediastinal contours are within normal limits. Both lungs are clear. The visualized skeletal structures are unremarkable. -Patient undergoing ECHOCardiogram  DVT prophylaxis: Enoxaparin 40 mg sq Daily Code Status: FULL CODE Family Communication: Discussed with Husband at bedside Disposition Plan: Pending PT Eval; Possible CIR  Consultants:   Neurology  Neurosurgery   Procedures: ECHOCARDIOGRAM   Antimicrobials:  Anti-infectives    None     Subjective: Seen and examined at bedside and stated that her Left side was weak and that  she had severe left leg weakness. No nausea or vomiting.   Objective: Vitals:   06/13/17 2358 06/14/17 0200 06/14/17 0400 06/14/17 0600  BP: 140/85 133/80 121/73 (!) 148/94  Pulse: 99 92 80 100  Resp: 18 19 18 20   Temp: 98.2 F (36.8 C)     TempSrc: Oral     SpO2: 100% 100% 98% 98%  Weight: 80.5 kg (177 lb 7.5 oz)     Height: 5' (1.524 m)       Intake/Output Summary (Last 24 hours) at 06/14/17 0853 Last data filed at 06/14/17 0313  Gross per 24 hour  Intake           556.67 ml  Output                0 ml  Net           556.67 ml   Filed Weights   06/13/17 0959 06/13/17 2358  Weight: 77.6 kg (171 lb) 80.5 kg (177 lb 7.5 oz)   Examination: Physical Exam:  Constitutional: WN/WD obese AAF in NAD and appears calm and comfortable Eyes: Lids and conjunctivae normal, sclerae anicteric  ENMT: External Ears, Nose appear normal. Grossly normal hearing. Mucous membranes are moist Neck: Appears normal, supple, no cervical masses, normal ROM, no appreciable thyromegaly, no JVD Respiratory: Diminished to auscultation bilaterally, no wheezing, rales, rhonchi or crackles. Normal respiratory effort and patient is not tachypenic. No  accessory muscle use.  Cardiovascular: RRR, no murmurs / rubs / gallops. S1 and S2 auscultated. No appreciable extremity edema. Abdomen: Soft, non-tender, non-distended. No masses palpated. No appreciable hepatosplenomegaly. Bowel sounds positive x4  GU: Deferred. Musculoskeletal: No clubbing / cyanosis of digits/nails. No joint deformity upper and lower extremities. Decreased strength in Upper Extremities Left worse than Right and Lower Extremities Left Worse than Right.   Skin: No rashes, lesions, ulcers on a limited skin eval. No induration; Warm and dry.  Neurologic: CN 2-12 grossly intact with no focal deficits. Sensation grossly intact. Romberg sign cerebellar reflexes not assessed.  Psychiatric: Normal judgment and insight. Alert and oriented x 3. Normal mood  and appropriate affect.   Data Reviewed: I have personally reviewed following labs and imaging studies  CBC:  Recent Labs Lab 06/13/17 1144  WBC 12.2*  NEUTROABS 8.2*  HGB 13.0  HCT 40.7  MCV 90.0  PLT 756   Basic Metabolic Panel:  Recent Labs Lab 06/13/17 1144  NA 139  K 4.0  CL 106  CO2 23  GLUCOSE 101*  BUN 9  CREATININE 0.62  CALCIUM 10.2   GFR: Estimated Creatinine Clearance: 77.3 mL/min (by C-G formula based on SCr of 0.62 mg/dL). Liver Function Tests:  Recent Labs Lab 06/13/17 1144  AST 31  ALT 38  ALKPHOS 61  BILITOT 0.4  PROT 7.6  ALBUMIN 3.9   No results for input(s): LIPASE, AMYLASE in the last 168 hours. No results for input(s): AMMONIA in the last 168 hours. Coagulation Profile:  Recent Labs Lab 06/13/17 1144  INR 0.94   Cardiac Enzymes: No results for input(s): CKTOTAL, CKMB, CKMBINDEX, TROPONINI in the last 168 hours. BNP (last 3 results) No results for input(s): PROBNP in the last 8760 hours. HbA1C:  Recent Labs  06/14/17 0534  HGBA1C 5.9*   CBG: No results for input(s): GLUCAP in the last 168 hours. Lipid Profile:  Recent Labs  06/14/17 0534  CHOL 141  HDL 43  LDLCALC 83  TRIG 74  CHOLHDL 3.3   Thyroid Function Tests: No results for input(s): TSH, T4TOTAL, FREET4, T3FREE, THYROIDAB in the last 72 hours. Anemia Panel: No results for input(s): VITAMINB12, FOLATE, FERRITIN, TIBC, IRON, RETICCTPCT in the last 72 hours. Sepsis Labs: No results for input(s): PROCALCITON, LATICACIDVEN in the last 168 hours.  No results found for this or any previous visit (from the past 240 hour(s)).   Radiology Studies: Ct Angio Head W Or Wo Contrast  Result Date: 06/14/2017 CLINICAL DATA:  Follow-up stroke and cerebral artery occlusion. EXAM: CT ANGIOGRAPHY HEAD AND NECK TECHNIQUE: Multidetector CT imaging of the head and neck was performed using the standard protocol during bolus administration of intravenous contrast. Multiplanar  CT image reconstructions and MIPs were obtained to evaluate the vascular anatomy. Carotid stenosis measurements (when applicable) are obtained utilizing NASCET criteria, using the distal internal carotid diameter as the denominator. CONTRAST:  50 cc Isovue 370 COMPARISON:  MRI and MRA head June 13, 2017 FINDINGS: CTA NECK AORTIC ARCH: Normal appearance of the thoracic arch, 2 vessel arch is a normal variant. Mild calcific atherosclerosis aortic arch. Intimal thickening resulting in moderate stenosis RIGHT subclavian artery origin. RIGHT CAROTID SYSTEM: Common carotid artery is widely patent, retropharyngeal course. Mild eccentric calcific atherosclerosis RIGHT carotid bifurcation without hemodynamically significant stenosis by NASCET criteria. RIGHT tonsillar loop. LEFT CAROTID SYSTEM: Common carotid artery is widely patent, coursing in a straight line fashion. Retropharyngeal course. Mild eccentric intimal thickening without hemodynamically significant stenosis by NASCET  criteria. VERTEBRAL ARTERIES:Left vertebral artery is dominant. Patent bilateral vertebral artery's, mild extrinsic deformity due to degenerative cervical spine. SKELETON: No acute osseous process though bone windows have not been submitted. OTHER NECK: Soft tissues of the neck are nonacute though, not tailored for evaluation. Reversed cervical lordosis with severe C5-6 and C6-7 degenerative discs, moderate at C4-5. Severe canal stenosis C5-6. Moderate to severe C5-6 and C6-7 neural foraminal narrowing. UPPER CHEST: Included lung apices are clear. No superior mediastinal lymphadenopathy. Mild suspected cardiomegaly with hazy ground-glass opacities and pulmonary vascular congestion. CTA HEAD ANTERIOR CIRCULATION: Calcific atherosclerosis resulting in severe stenosis RIGHT anterior genu of the internal carotid artery to paraophthalmic segment. 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 Moderate stenosis LEFT  supraclinoid internal carotid artery. Thready RIGHT A1 segment with moderate luminal irregularity LEFT A1 segment. Patent anterior communicating artery. Bilateral anterior cerebral artery's are patent. Occluded LEFT M2 origin, superior division. Thready inferior division with poor collateralization in pruning. Moderate stenosis RIGHT M1 segment. Severe stenosis proximal RIGHT M2 segment. No large vessel occlusion, significant stenosis, contrast extravasation or aneurysm. POSTERIOR CIRCULATION: Patent vertebral arteries, vertebrobasilar junction and basilar artery, as well as main branch vessels. Occluded RIGHT proximal P2 segment with mild collateralization. Severe stenosis LEFT P3 origin. No large vessel occlusion, significant stenosis, contrast extravasation or aneurysm. VENOUS SINUSES: Major dural venous sinuses are patent though not tailored for evaluation on this angiographic examination. ANATOMIC VARIANTS: None. DELAYED PHASE: No abnormal intracranial enhancement. MIP images reviewed. IMPRESSION: CTA NECK: 1. Atherosclerosis without hemodynamically significant stenosis or acute vascular process in the neck. 2. Suspected cardiomegaly and pulmonary edema. Recommend chest radiograph. 3. Degenerative cervical spine resulting in severe canal stenosis C5-6. Moderate to severe C5-6 and C6-7 neural foraminal narrowing. CTA HEAD: 1. Severe stenosis RIGHT internal carotid artery at anterior genu/supraclinoid segment due to advanced atherosclerosis. 2. 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 3. Occluded RIGHT PCA at proximal P2 segment, poor collateralization and, likely acute given recent MRI findings. 4. Severe stenosis RIGHT M2 and LEFT P3 segments. Moderate stenosis RIGHT M1 segment. 5. 2 mm RIGHT PCOM origin aneurysm. Aortic Atherosclerosis (ICD10-I70.0). Electronically Signed   By: Elon Alas M.D.   On: 06/14/2017 02:28   Ct Head Wo Contrast  Result Date: 06/13/2017 CLINICAL DATA:  Bilateral  lower extremity weakness for 2 days. History of stroke with residual right-sided weakness. EXAM: CT HEAD WITHOUT CONTRAST TECHNIQUE: Contiguous axial images were obtained from the base of the skull through the vertex without intravenous contrast. COMPARISON:  None. FINDINGS: Brain: There is a moderate-sized chronic left MCA infarct in the parietal lobe. A chronic lateral lenticulostriate infarct is noted involving the anterior limb of the left internal capsule and basal ganglia. There is slight ex vacuo enlargement of the left lateral ventricle. There is no evidence of acute infarct, intracranial hemorrhage, mass, midline shift, or extra-axial fluid collection. Vascular: Calcified atherosclerosis at the skullbase. No hyperdense vessel. Skull: No fracture or focal osseous lesion. Sinuses/Orbits: Unremarkable orbits. Partially visualized small osteoma in the left maxillary sinus. Clear mastoid air cells. Other: None. IMPRESSION: 1. No evidence of acute intracranial abnormality. 2. Chronic left parietal and basal ganglia infarcts. Electronically Signed   By: Logan Bores M.D.   On: 06/13/2017 10:47   Ct Angio Neck W Or Wo Contrast  Result Date: 06/14/2017 CLINICAL DATA:  Follow-up stroke and cerebral artery occlusion. EXAM: CT ANGIOGRAPHY HEAD AND NECK TECHNIQUE: Multidetector CT imaging of the head and neck was performed using the  standard protocol during bolus administration of intravenous contrast. Multiplanar CT image reconstructions and MIPs were obtained to evaluate the vascular anatomy. Carotid stenosis measurements (when applicable) are obtained utilizing NASCET criteria, using the distal internal carotid diameter as the denominator. CONTRAST:  50 cc Isovue 370 COMPARISON:  MRI and MRA head June 13, 2017 FINDINGS: CTA NECK AORTIC ARCH: Normal appearance of the thoracic arch, 2 vessel arch is a normal variant. Mild calcific atherosclerosis aortic arch. Intimal thickening resulting in moderate stenosis  RIGHT subclavian artery origin. RIGHT CAROTID SYSTEM: Common carotid artery is widely patent, retropharyngeal course. Mild eccentric calcific atherosclerosis RIGHT carotid bifurcation without hemodynamically significant stenosis by NASCET criteria. RIGHT tonsillar loop. LEFT CAROTID SYSTEM: Common carotid artery is widely patent, coursing in a straight line fashion. Retropharyngeal course. Mild eccentric intimal thickening without hemodynamically significant stenosis by NASCET criteria. VERTEBRAL ARTERIES:Left vertebral artery is dominant. Patent bilateral vertebral artery's, mild extrinsic deformity due to degenerative cervical spine. SKELETON: No acute osseous process though bone windows have not been submitted. OTHER NECK: Soft tissues of the neck are nonacute though, not tailored for evaluation. Reversed cervical lordosis with severe C5-6 and C6-7 degenerative discs, moderate at C4-5. Severe canal stenosis C5-6. Moderate to severe C5-6 and C6-7 neural foraminal narrowing. UPPER CHEST: Included lung apices are clear. No superior mediastinal lymphadenopathy. Mild suspected cardiomegaly with hazy ground-glass opacities and pulmonary vascular congestion. CTA HEAD ANTERIOR CIRCULATION: Calcific atherosclerosis resulting in severe stenosis RIGHT anterior genu of the internal carotid artery to paraophthalmic segment. 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 Moderate stenosis LEFT supraclinoid internal carotid artery. Thready RIGHT A1 segment with moderate luminal irregularity LEFT A1 segment. Patent anterior communicating artery. Bilateral anterior cerebral artery's are patent. Occluded LEFT M2 origin, superior division. Thready inferior division with poor collateralization in pruning. Moderate stenosis RIGHT M1 segment. Severe stenosis proximal RIGHT M2 segment. No large vessel occlusion, significant stenosis, contrast extravasation or aneurysm. POSTERIOR CIRCULATION: Patent  vertebral arteries, vertebrobasilar junction and basilar artery, as well as main branch vessels. Occluded RIGHT proximal P2 segment with mild collateralization. Severe stenosis LEFT P3 origin. No large vessel occlusion, significant stenosis, contrast extravasation or aneurysm. VENOUS SINUSES: Major dural venous sinuses are patent though not tailored for evaluation on this angiographic examination. ANATOMIC VARIANTS: None. DELAYED PHASE: No abnormal intracranial enhancement. MIP images reviewed. IMPRESSION: CTA NECK: 1. Atherosclerosis without hemodynamically significant stenosis or acute vascular process in the neck. 2. Suspected cardiomegaly and pulmonary edema. Recommend chest radiograph. 3. Degenerative cervical spine resulting in severe canal stenosis C5-6. Moderate to severe C5-6 and C6-7 neural foraminal narrowing. CTA HEAD: 1. Severe stenosis RIGHT internal carotid artery at anterior genu/supraclinoid segment due to advanced atherosclerosis. 2. 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 3. Occluded RIGHT PCA at proximal P2 segment, poor collateralization and, likely acute given recent MRI findings. 4. Severe stenosis RIGHT M2 and LEFT P3 segments. Moderate stenosis RIGHT M1 segment. 5. 2 mm RIGHT PCOM origin aneurysm. Aortic Atherosclerosis (ICD10-I70.0). Electronically Signed   By: Elon Alas M.D.   On: 06/14/2017 02:28   Mr Brain Wo Contrast (neuro Protocol)  Result Date: 06/13/2017 CLINICAL DATA:  Initial evaluation for acute left leg weakness. EXAM: MRI HEAD WITHOUT CONTRAST TECHNIQUE: Multiplanar, multiecho pulse sequences of the brain and surrounding structures were obtained without intravenous contrast. COMPARISON:  Prior CT from earlier the same day. FINDINGS: Brain: Generalized age appropriate cerebral atrophy. Encephalomalacia with gliosis within the posterior left frontoparietal region compatible with remote left MCA territory infarct.  Additional remote lacunar type infarcts present  within the left basal ganglia. Approximate 1 cm focus of restricted diffusion at the lateral right midbrain at the level the right cortical spinal tract (series 3, image 24), consistent with an acute ischemic infarct. No associated mass effect or hemorrhage. No other evidence for acute or subacute ischemia. No other areas of chronic infarction. No other acute or chronic intracranial hemorrhage. No mass lesion, midline shift or mass effect. No hydrocephalus. No extra-axial fluid collection. Major dural sinuses are grossly patent. Pituitary gland suprasellar region within normal limits. Vascular: Major intracranial vascular flow voids maintained. Skull and upper cervical spine: Craniocervical junction normal. Mild degenerate spondylolysis noted within the upper cervical spine without significant stenosis. Bone marrow signal intensity within normal limits. No scalp soft tissue abnormality. Sinuses/Orbits: Globes and orbital soft tissues within normal limits. Mild scattered mucosal thickening within the ethmoidal air cells and maxillary sinuses. Paranasal sinuses are otherwise clear. No mastoid effusion. Inner ear structures normal. IMPRESSION: 1. 1 cm acute ischemic nonhemorrhagic infarct involving the lateral right midbrain at the level the right corticospinal tract. No associated mass effect. 2. Remote posterior left MCA territory and left basal ganglia infarcts as above. Electronically Signed   By: Jeannine Boga M.D.   On: 06/13/2017 18:15   Mr Lumbar Spine Wo Contrast  Result Date: 06/13/2017 CLINICAL DATA:  Initial evaluation for acute back pain, leg weakness. EXAM: MRI LUMBAR SPINE WITHOUT CONTRAST TECHNIQUE: Multiplanar, multisequence MR imaging of the lumbar spine was performed. No intravenous contrast was administered. COMPARISON:  None. FINDINGS: Segmentation: Normal segmentation. Lowest well-formed disc labeled the L5-S1 level. Alignment: 4 mm anterolisthesis of L4 on L5. Trace retrolisthesis of  L2 on L3 and L3 on L4. Vertebral bodies otherwise normally aligned with preservation of the normal lumbar lordosis. Vertebrae: Vertebral body heights maintained. No evidence for acute or chronic fracture. Signal intensity within the vertebral body bone marrow within normal limits. No discrete or worrisome osseous lesions. No abnormal marrow edema. Conus medullaris: Extends to the L1 level and appears normal. Paraspinal and other soft tissues: Paraspinous soft tissues demonstrate no acute abnormality. Fibroid uterus noted. Partially visualized visceral structures otherwise unremarkable. Disc levels: Mild diffuse congenital shortening of the pedicles noted. L1-2:  Unremarkable. L2-3: Diffuse degenerative disc bulge with disc desiccation. Superimposed left foraminal/ extraforaminal disc protrusion, contacting the exiting left L2 nerve root as it courses of the left neural foramen (series 4, image 11). No significant canal stenosis. Mild bilateral L2 foraminal stenosis related to disc bulge and short pedicles. L3-4: Diffuse disc bulge with disc desiccation and mild intervertebral disc space narrowing. Mild facet and ligamentum flavum hypertrophy. Short pedicles. Resultant moderate canal and bilateral subarticular stenosis. Mild to moderate bilateral L3 foraminal narrowing. L4-5: 4 mm anterolisthesis of L4 on L5. Associated diffuse disc bulge. Moderate bilateral facet arthrosis with ligamentum flavum hypertrophy, slightly worse on the right. Short pedicles. Severe canal and bilateral subarticular stenosis. Moderate bilateral L4 foraminal narrowing, right worse than left. L5-S1: Diffuse disc bulge with disc desiccation and intervertebral disc space narrowing. Superimposed left subarticular disc protrusion encroaches upon the left lateral recess, impinging upon the descending left S1 nerve root (series 5, image 25). Mild bilateral facet hypertrophy. Relatively mild canal narrowing. Mild left L5 foraminal stenosis present  as well. IMPRESSION: 1. 4 mm anterolisthesis of L4 on L5 with associated disc bulge and moderate facet arthropathy, resulting in severe canal and bilateral subarticular stenosis, with moderate bilateral L4 foraminal narrowing. 2. Left subarticular disc protrusion at  L5-S1, impinging upon the descending left S1 nerve root in the left lateral recess. 3. Left foraminal/extraforaminal disc protrusion at L2-3, contacting and potentially irritating the exiting left L2 nerve root. 4. Diffuse congenital shortening of pedicles. 5. Fibroid uterus. Electronically Signed   By: Jeannine Boga M.D.   On: 06/13/2017 18:41   Dg Chest Port 1 View  Result Date: 06/14/2017 CLINICAL DATA:  Acute ischemic stroke.  Hypertension. EXAM: PORTABLE CHEST 1 VIEW COMPARISON:  None. FINDINGS: The heart size and mediastinal contours are within normal limits. Both lungs are clear. The visualized skeletal structures are unremarkable. IMPRESSION: No active disease. Electronically Signed   By: Earle Gell M.D.   On: 06/14/2017 07:43   Mr Jodene Nam Head Wo Contrast  Result Date: 06/13/2017 CLINICAL DATA:  Follow-up acute midbrain infarct. Recent unsteady gait. History of strokes. EXAM: MRA HEAD WITHOUT CONTRAST TECHNIQUE: Angiographic images of the Circle of Willis were obtained using MRA technique without intravenous contrast. COMPARISON:  MRI of the head June 13, 2017 at 1719 hours. FINDINGS: ANTERIOR CIRCULATION: Normal flow related enhancement of the included cervical, petrous, cavernous and supraclinoid internal carotid arteries. 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 Patent fenestrated anterior communicating artery. Patent anterior cerebral arteries, including distal segments. Occluded LEFT proximal M2 segment with paucity of mid to distal LEFT middle cerebral artery's. Severe stenosis proximal RIGHT M2 segment. Moderate  luminal regularity proximal RIGHT middle cerebral artery's. POSTERIOR CIRCULATION: LEFT vertebral artery is dominant. Basilar artery is patent, with normal flow related enhancement of the main branch vessels. Occluded RIGHT proximal P2 segment with thready collaterals. Small bilateral posterior communicating artery is present. Severe stenosis proximal LEFT P3 segment. No large vessel occlusion, high-grade stenosis,  aneurysm. ANATOMIC VARIANTS: None. Source images and MIP images were reviewed. IMPRESSION: 1. 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 2. Occluded LEFT middle cerebral artery at M2 origin with poor collateralization. 3. Severe stenosis proximal RIGHT M2 segment and proximal LEFT P3 segment. 4. Focal irregularity and probable stenosis RIGHT supraclinoid internal carotid artery. 5. 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 6. Recommend CTA for further characterization. Acute findings discussed with and reconfirmed by Dr.TIMOTHY OPYD on 06/13/2017 at 11:50 pm. Electronically Signed   By: Elon Alas M.D.   On: 06/13/2017 23:50   Scheduled Meds: . aspirin  300 mg Rectal Daily   Or  . aspirin  325 mg Oral Daily  . atorvastatin  80 mg Oral q1800  . clopidogrel  75 mg Oral Daily  . docusate sodium  100 mg Oral Daily  . enoxaparin (LOVENOX) injection  40 mg Subcutaneous Daily  . ferrous sulfate  325 mg Oral Q breakfast  . lamoTRIgine  200 mg Oral Daily  . vitamin B-12  1,000 mcg Oral Daily  . zolpidem  5 mg Oral QHS   Continuous Infusions:   LOS: 1 day   Kerney Elbe, DO [facility] Pager [phone]  If 7PM-7AM, please contact night-coverage [email or web address] Password [facility] 06/14/2017, 8:53 AM

## 2017-06-14 NOTE — Progress Notes (Signed)
Occupational Therapy Evaluation Patient Details Name: Leslie Simmons MRN: [id number] DOB: [birth year] Today's Date: 06/14/2017    History of Present Illness 51 y.o. female  with PMH significant for hypertension, prior Left MCA stroke ( ?? Hemorrhagic per patient)  with residual right-sided hemiparesis presents to the emergency room for left leg weakness and back pain that began on 8.29.18 evening.MRI brain showed a right midbrain stroke. MRI of lumbar showed severe stenosis at L4-5 with grade 1 spondylolisthesis, L5-S1 herniated disc, and moderate stenosis at L3-4.    Clinical Impression   PTA, pt lived in [place] with her daughter and was modified independent with ADL and mobility without an AD. Pt presents with significant decline in functional status due to deficits listed below and would benefit form CIR to achieve modified independent level with ADL and mobility to facilitate safe DC home with family. Pt very motivated to return to PLOF. Tearful during session at times. Will follow acutely to address established goals and facilitate DC to next venue of care.     Follow Up Recommendations  CIR;Supervision/Assistance - 24 hour    Equipment Recommendations  3 in 1 bedside commode;Other (comment) (RW)    Recommendations for Other Services       Precautions / Restrictions Precautions Precautions: Fall Restrictions Weight Bearing Restrictions: No      Mobility Bed Mobility Overal bed mobility: Needs Assistance Bed Mobility: Sit to Sidelying         Sit to sidelying: Mod assist General bed mobility comments: to lift BLE onto bed  Transfers Overall transfer level: Needs assistance   Transfers: Sit to/from Stand;Stand Pivot Transfers Sit to Stand: Min assist Stand pivot transfers: Mod assist            Balance Overall balance assessment: Needs assistance   Sitting balance-Leahy Scale: Good       Standing balance-Leahy Scale: Poor Standing balance comment: heavy  reliance on RW                           ADL either performed or assessed with clinical judgement   ADL Overall ADL's : Needs assistance/impaired Eating/Feeding: Minimal assistance Eating/Feeding Details (indicate cue type and reason): will further assess need for AE Grooming: Minimal assistance   Upper Body Bathing: Minimal assistance;Sitting   Lower Body Bathing: Moderate assistance;Sit to/from stand   Upper Body Dressing : Moderate assistance;Sitting   Lower Body Dressing: Moderate assistance;Sit to/from stand   Toilet Transfer: RW;Moderate assistance (LLE buckling at times)   Toileting- Water quality scientist and Hygiene: Moderate assistance;Sit to/from stand       Functional mobility during ADLs: Moderate assistance;Rolling walker;Cueing for safety       Vision Baseline Vision/History: Wears glasses Wears Glasses: At all times Patient Visual Report: Blurring of vision Vision Assessment?: Vision impaired- to be further tested in functional context Additional Comments: Pt states vision was blurry when "this happened" but seems "better". Will further assess. Asked family to bring in patient's glasses     Perception Perception Comments: good useof space. no over/undershooting noted   Praxis Praxis Praxis tested?: Within functional limits    Pertinent Vitals/Pain Pain Assessment: 0-10 Pain Score: 8  Pain Location: low back Pain Descriptors / Indicators: Aching;Sharp Pain Intervention(s): Limited activity within patient's tolerance;Repositioned     Hand Dominance Left (R but uses L due to stroke affecting R)   Extremity/Trunk Assessment Upper Extremity Assessment Upper Extremity Assessment: RUE deficits/detail;LUE deficits/detail RUE Deficits / Details: residual  weakness and incoordination form previous CVa. Uses as functional assist RUE Sensation: decreased light touch;decreased proprioception RUE Coordination: decreased fine motor LUE Deficits /  Details: LUE generalized weakness. Stronger proximally. Able to complete full ROM with isolated movements out of synergy. Difficulty with coordinated movements adn in hand manipulation skills. Hand slipping off chair when pushing up. Able to maintain grasp on RW. LUE Sensation: decreased light touch LUE Coordination: decreased fine motor   Lower Extremity Assessment Lower Extremity Assessment: Defer to PT evaluation (Difficulty dvancing LLE)   Cervical / Trunk Assessment Cervical / Trunk Assessment: Other exceptions Cervical / Trunk Exceptions: painful back. limited trunk flexion due to pain   Communication Communication Communication: No difficulties   Cognition Arousal/Alertness: Awake/alert Behavior During Therapy: Flat affect (tearful) Overall Cognitive Status: Within Functional Limits for tasks assessed                                 General Comments: pt withslow processing; most likely baseline, will further assess   General Comments       Exercises Exercises: Other exercises Other Exercises Other Exercises: encouraged use of LUE   Shoulder Instructions      Home Living Family/patient expects to be discharged to:: Private residence Living Arrangements: Spouse/significant other;Children Available Help at Discharge: Family;Available PRN/intermittently Type of Home: House Home Access: Stairs to enter [employer] of Steps: 2   Home Layout: One level     Bathroom Shower/Tub: Tub/shower unit;Walk-in shower   Bathroom Toilet: Standard Bathroom Accessibility: Yes How Accessible: Accessible via walker Home Equipment: None (Pt has RW in [place])      Lives With: Family (plans to live with significant other in [place])    Prior Functioning/Environment Level of Independence: Independent                 OT Problem List: Decreased strength;Impaired balance (sitting and/or standing);Decreased coordination;Decreased activity  tolerance;Impaired vision/perception;Decreased safety awareness;Impaired sensation;Obesity;Impaired UE functional use;Pain      OT Treatment/Interventions: Self-care/ADL training;Therapeutic exercise;Neuromuscular education;DME and/or AE instruction;Therapeutic activities;Visual/perceptual remediation/compensation;Patient/family education;Balance training    OT Goals(Current goals can be found in the care plan section) Acute Rehab OT Goals Patient Stated Goal: to get better OT Goal Formulation: With patient Time For Goal Achievement: 06/28/17 Potential to Achieve Goals: Good ADL Goals Pt Will Perform Eating: with modified independence;sitting;with adaptive utensils Pt Will Perform Grooming: with modified independence;sitting Pt Will Perform Upper Body Bathing: with modified independence;sitting Pt Will Perform Lower Body Bathing: with modified independence;sit to/from stand;with adaptive equipment Pt Will Transfer to Toilet: with modified independence;bedside commode;ambulating  OT Frequency: Min 2X/week   Barriers to D/C:            Co-evaluation              AM-PAC PT "6 Clicks" Daily Activity     Outcome Measure Help from another person eating meals?: A Little Help from another person taking care of personal grooming?: A Little Help from another person toileting, which includes using toliet, bedpan, or urinal?: A Lot Help from another person bathing (including washing, rinsing, drying)?: A Little Help from another person to put on and taking off regular upper body clothing?: A Little Help from another person to put on and taking off regular lower body clothing?: A Lot 6 Click Score: 16   End of Session Equipment Utilized During Treatment: Gait belt;Rolling walker Nurse Communication: Mobility status  Activity Tolerance: Patient tolerated treatment  well Patient left: in chair;with call bell/phone within reach;with family/visitor present;with chair alarm set  OT Visit  Diagnosis: Other abnormalities of gait and mobility (R26.89);Muscle weakness (generalized) (M62.81);Pain Pain - part of body:  (back)                Time: 6812-7517 OT Time Calculation (min): 28 min Charges:  OT General Charges $OT Visit: 1 Visit OT Evaluation $OT Eval Moderate Complexity: 1 Mod OT Treatments $Self Care/Home Management : 8-22 mins G-Codes:     [facility], OT/L  [phone] 06/14/2017  Marcella Dunnaway,HILLARY 06/14/2017, 11:56 AM

## 2017-06-14 NOTE — Progress Notes (Signed)
  Echocardiogram 2D Echocardiogram has been performed.  Laasya, Peyton 06/14/2017, 11:51 AM

## 2017-06-14 NOTE — Consult Note (Signed)
Physical Medicine and Rehabilitation Consult   Reason for Consult: Stroke with deficits in mobility/ADLs Referring Physician: Dr. Alfredia Ferguson.    HPI: Leslie Simmons is a 51 y.o. female with history fo HTN, hemorrhagic stroke with residual right sided weakness and seizure who was admitted on 06/13/17 with reports of back pain and LLE weakness since the night before. MRI brain done revealing acute nonhemorrhagic infarct involving lateral right midbrain at right corticospinal tract and  Remote L-MCA and basal ganglia infarcts. MRI lumbar spine done revealing L4 onL5 anterolisthesis with severe canal and bilateral subarticular stenosis and left subarticular  Disc protrusion L5-S1 with impingement of S1 nerve. Work up ongoing and NS consulted for input on stenosis. Dr.  Ronnald Ramp recommended therapy and elected decompression in the future. Therapy evaluations initiated revealing deficits in gait, ability to ADLs, lability and mild dysarthria with mild deficits in working memory. CIR recommended by rehab team.    ROS    Past Medical History:  Diagnosis Date  . Hypertension   . Stroke [facility])     History reviewed. No pertinent surgical history.    Family History  Problem Relation Age of Onset  . Stroke Son     Social History:  Lives with sister in [place]. Currently visiting boyfriend in [place]. reports that she has never smoked. She has never used smokeless tobacco. She reports that she does not drink alcohol or use drugs.    Allergies  Allergen Reactions  . Gabapentin Swelling  . Codeine Rash    Medications Prior to Admission  Medication Sig Dispense Refill  . aspirin EC 81 MG tablet Take 81 mg by mouth daily.    Marland Kitchen atorvastatin (LIPITOR) 10 MG tablet Take 10 mg by mouth at bedtime.    . Cyanocobalamin (VITAMIN B-12) 1000 MCG SUBL Place 1,000 mcg under the tongue daily.    Marland Kitchen docusate sodium (COLACE) 100 MG capsule Take 100 mg by mouth daily.    . ferrous sulfate 325 (65 FE) MG tablet Take 325 mg  by mouth daily.    . hydrochlorothiazide (HYDRODIURIL) 25 MG tablet Take 25 mg by mouth every morning.    Marland Kitchen ibuprofen (ADVIL,MOTRIN) 600 MG tablet Take 600 mg by mouth every 8 (eight) hours as needed for pain.    Marland Kitchen lamoTRIgine (LAMICTAL) 100 MG tablet Take 200 mg by mouth daily.    . metoprolol tartrate (LOPRESSOR) 100 MG tablet Take 100 mg by mouth 2 (two) times daily.    . valsartan (DIOVAN) 320 MG tablet Take 320 mg by mouth daily.    Marland Kitchen zolpidem (AMBIEN) 10 MG tablet Take 10 mg by mouth at bedtime.      Home: Home Living Family/patient expects to be discharged to:: Private residence Living Arrangements: Spouse/significant other, Children Available Help at Discharge: Family, Available PRN/intermittently Type of Home: House Home Access: Stairs to enter Technical brewer of Steps: 2 [place]: One level Bathroom Shower/Tub: Tub/shower unit, Multimedia programmer: Standard Bathroom Accessibility: Yes Home Equipment: None (Pt has RW in [place])  Lives With: Family (plans to live with significant other in [place])  Functional History: Prior Function Level of Independence: Independent Functional Status:  Mobility: Bed Mobility Overal bed mobility: Needs Assistance Bed Mobility: Sit to Sidelying Sit to sidelying: Mod assist General bed mobility comments: to lift BLE onto bed Transfers Overall transfer level: Needs assistance Transfers: Sit to/from Stand, Stand Pivot Transfers Sit to Stand: Min assist Stand pivot transfers: Mod assist  ADL: ADL Overall ADL's : Needs assistance/impaired Eating/Feeding: Minimal assistance Eating/Feeding Details (indicate cue type and reason): will further assess need for AE Grooming: Minimal assistance Upper Body Bathing: Minimal assistance, Sitting Lower Body Bathing: Moderate assistance, Sit to/from stand Upper Body Dressing : Moderate assistance, Sitting Lower Body Dressing: Moderate assistance, Sit to/from  stand Toilet Transfer: RW, Moderate assistance (LLE buckling at times) Toileting- Clothing Manipulation and Hygiene: Moderate assistance, Sit to/from stand Functional mobility during ADLs: Moderate assistance, Rolling walker, Cueing for safety  Cognition: Cognition Overall Cognitive Status: Within Functional Limits for tasks assessed Arousal/Alertness: Awake/alert Orientation Level: Oriented X4 Attention: Sustained Sustained Attention: Appears intact Memory: Impaired Memory Impairment: Retrieval deficit Awareness: Appears intact Safety/Judgment: Appears intact Cognition Arousal/Alertness: Awake/alert Behavior During Therapy: Flat affect (tearful) Overall Cognitive Status: Within Functional Limits for tasks assessed General Comments: pt withslow processing; most likely baseline, will further assess  Blood pressure 138/88, pulse 95, temperature 98.8 F (37.1 C), temperature source Oral, resp. rate 20, height 5' (1.524 m), weight 80.5 kg (177 lb 7.5 oz), SpO2 98 %. Physical Exam  Results for orders placed or performed during the hospital encounter of 06/13/17 (from the past 24 hour(s))  Hemoglobin A1c     Status: Abnormal   Collection Time: 06/14/17  5:34 AM  Result Value Ref Range   Hgb A1c MFr Bld 5.9 (H) 4.8 - 5.6 %   Mean Plasma Glucose 122.63 mg/dL  Lipid panel     Status: None   Collection Time: 06/14/17  5:34 AM  Result Value Ref Range   Cholesterol 141 0 - 200 mg/dL   Triglycerides 74 <150 mg/dL   HDL 43 >40 mg/dL   Total CHOL/HDL Ratio 3.3 RATIO   VLDL 15 0 - 40 mg/dL   LDL Cholesterol 83 0 - 99 mg/dL  CBC with Differential/Platelet     Status: Abnormal   Collection Time: 06/14/17  9:13 AM  Result Value Ref Range   WBC 12.1 (H) 4.0 - 10.5 K/uL   RBC 4.21 3.87 - 5.11 MIL/uL   Hemoglobin 12.1 12.0 - 15.0 g/dL   HCT 37.8 36.0 - 46.0 %   MCV 89.8 78.0 - 100.0 fL   MCH 28.7 26.0 - 34.0 pg   MCHC 32.0 30.0 - 36.0 g/dL   RDW 15.0 11.5 - 15.5 %   Platelets 280 150 -  400 K/uL   Neutrophils Relative % 67 %   Neutro Abs 8.0 (H) 1.7 - 7.7 K/uL   Lymphocytes Relative 27 %   Lymphs Abs 3.3 0.7 - 4.0 K/uL   Monocytes Relative 5 %   Monocytes Absolute 0.7 0.1 - 1.0 K/uL   Eosinophils Relative 1 %   Eosinophils Absolute 0.1 0.0 - 0.7 K/uL   Basophils Relative 0 %   Basophils Absolute 0.0 0.0 - 0.1 K/uL  Comprehensive metabolic panel     Status: Abnormal   Collection Time: 06/14/17  9:13 AM  Result Value Ref Range   Sodium 141 135 - 145 mmol/L   Potassium 3.6 3.5 - 5.1 mmol/L   Chloride 110 101 - 111 mmol/L   CO2 23 22 - 32 mmol/L   Glucose, Bld 101 (H) 65 - 99 mg/dL   BUN 9 6 - 20 mg/dL   Creatinine, Ser 0.56 0.44 - 1.00 mg/dL   Calcium 9.2 8.9 - 10.3 mg/dL   Total Protein 6.8 6.5 - 8.1 g/dL   Albumin 3.4 (L) 3.5 - 5.0 g/dL   AST 26 15 - 41 U/L  ALT 32 14 - 54 U/L   Alkaline Phosphatase 52 38 - 126 U/L   Total Bilirubin 0.5 0.3 - 1.2 mg/dL   GFR calc non Af Amer >60 >60 mL/min   GFR calc Af Amer >60 >60 mL/min   Anion gap 8 5 - 15  Magnesium     Status: None   Collection Time: 06/14/17  9:13 AM  Result Value Ref Range   Magnesium 1.7 1.7 - 2.4 mg/dL  Phosphorus     Status: None   Collection Time: 06/14/17  9:13 AM  Result Value Ref Range   Phosphorus 3.9 2.5 - 4.6 mg/dL   Ct Angio Head W Or Wo Contrast  Result Date: 06/14/2017 CLINICAL DATA:  Follow-up stroke and cerebral artery occlusion. EXAM: CT ANGIOGRAPHY HEAD AND NECK TECHNIQUE: Multidetector CT imaging of the head and neck was performed using the standard protocol during bolus administration of intravenous contrast. Multiplanar CT image reconstructions and MIPs were obtained to evaluate the vascular anatomy. Carotid stenosis measurements (when applicable) are obtained utilizing NASCET criteria, using the distal internal carotid diameter as the denominator. CONTRAST:  50 cc Isovue 370 COMPARISON:  MRI and MRA head June 13, 2017 FINDINGS: CTA NECK AORTIC ARCH: Normal appearance of the  thoracic arch, 2 vessel arch is a normal variant. Mild calcific atherosclerosis aortic arch. Intimal thickening resulting in moderate stenosis RIGHT subclavian artery origin. RIGHT CAROTID SYSTEM: Common carotid artery is widely patent, retropharyngeal course. Mild eccentric calcific atherosclerosis RIGHT carotid bifurcation without hemodynamically significant stenosis by NASCET criteria. RIGHT tonsillar loop. LEFT CAROTID SYSTEM: Common carotid artery is widely patent, coursing in a straight line fashion. Retropharyngeal course. Mild eccentric intimal thickening without hemodynamically significant stenosis by NASCET criteria. VERTEBRAL ARTERIES:Left vertebral artery is dominant. Patent bilateral vertebral artery's, mild extrinsic deformity due to degenerative cervical spine. SKELETON: No acute osseous process though bone windows have not been submitted. OTHER NECK: Soft tissues of the neck are nonacute though, not tailored for evaluation. Reversed cervical lordosis with severe C5-6 and C6-7 degenerative discs, moderate at C4-5. Severe canal stenosis C5-6. Moderate to severe C5-6 and C6-7 neural foraminal narrowing. UPPER CHEST: Included lung apices are clear. No superior mediastinal lymphadenopathy. Mild suspected cardiomegaly with hazy ground-glass opacities and pulmonary vascular congestion. CTA HEAD ANTERIOR CIRCULATION: Calcific atherosclerosis resulting in severe stenosis RIGHT anterior genu of the internal carotid artery to paraophthalmic segment. 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 Moderate stenosis LEFT supraclinoid internal carotid artery. Thready RIGHT A1 segment with moderate luminal irregularity LEFT A1 segment. Patent anterior communicating artery. Bilateral anterior cerebral artery's are patent. Occluded LEFT M2 origin, superior division. Thready inferior division with poor collateralization in pruning. Moderate stenosis RIGHT M1 segment. Severe stenosis  proximal RIGHT M2 segment. No large vessel occlusion, significant stenosis, contrast extravasation or aneurysm. POSTERIOR CIRCULATION: Patent vertebral arteries, vertebrobasilar junction and basilar artery, as well as main branch vessels. Occluded RIGHT proximal P2 segment with mild collateralization. Severe stenosis LEFT P3 origin. No large vessel occlusion, significant stenosis, contrast extravasation or aneurysm. VENOUS SINUSES: Major dural venous sinuses are patent though not tailored for evaluation on this angiographic examination. ANATOMIC VARIANTS: None. DELAYED PHASE: No abnormal intracranial enhancement. MIP images reviewed. IMPRESSION: CTA NECK: 1. Atherosclerosis without hemodynamically significant stenosis or acute vascular process in the neck. 2. Suspected cardiomegaly and pulmonary edema. Recommend chest radiograph. 3. Degenerative cervical spine resulting in severe canal stenosis C5-6. Moderate to severe C5-6 and C6-7 neural foraminal narrowing. CTA HEAD: 1. Severe stenosis RIGHT  internal carotid artery at anterior genu/supraclinoid segment due to advanced atherosclerosis. 2. 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 3. Occluded RIGHT PCA at proximal P2 segment, poor collateralization and, likely acute given recent MRI findings. 4. Severe stenosis RIGHT M2 and LEFT P3 segments. Moderate stenosis RIGHT M1 segment. 5. 2 mm RIGHT PCOM origin aneurysm. Aortic Atherosclerosis (ICD10-I70.0). Electronically Signed   By: Elon Alas M.D.   On: 06/14/2017 02:28   Ct Head Wo Contrast  Result Date: 06/13/2017 CLINICAL DATA:  Bilateral lower extremity weakness for 2 days. History of stroke with residual right-sided weakness. EXAM: CT HEAD WITHOUT CONTRAST TECHNIQUE: Contiguous axial images were obtained from the base of the skull through the vertex without intravenous contrast. COMPARISON:  None. FINDINGS: Brain: There is a moderate-sized chronic left MCA infarct in the parietal lobe. A chronic  lateral lenticulostriate infarct is noted involving the anterior limb of the left internal capsule and basal ganglia. There is slight ex vacuo enlargement of the left lateral ventricle. There is no evidence of acute infarct, intracranial hemorrhage, mass, midline shift, or extra-axial fluid collection. Vascular: Calcified atherosclerosis at the skullbase. No hyperdense vessel. Skull: No fracture or focal osseous lesion. Sinuses/Orbits: Unremarkable orbits. Partially visualized small osteoma in the left maxillary sinus. Clear mastoid air cells. Other: None. IMPRESSION: 1. No evidence of acute intracranial abnormality. 2. Chronic left parietal and basal ganglia infarcts. Electronically Signed   By: Logan Bores M.D.   On: 06/13/2017 10:47   Ct Angio Neck W Or Wo Contrast  Result Date: 06/14/2017 CLINICAL DATA:  Follow-up stroke and cerebral artery occlusion. EXAM: CT ANGIOGRAPHY HEAD AND NECK TECHNIQUE: Multidetector CT imaging of the head and neck was performed using the standard protocol during bolus administration of intravenous contrast. Multiplanar CT image reconstructions and MIPs were obtained to evaluate the vascular anatomy. Carotid stenosis measurements (when applicable) are obtained utilizing NASCET criteria, using the distal internal carotid diameter as the denominator. CONTRAST:  50 cc Isovue 370 COMPARISON:  MRI and MRA head June 13, 2017 FINDINGS: CTA NECK AORTIC ARCH: Normal appearance of the thoracic arch, 2 vessel arch is a normal variant. Mild calcific atherosclerosis aortic arch. Intimal thickening resulting in moderate stenosis RIGHT subclavian artery origin. RIGHT CAROTID SYSTEM: Common carotid artery is widely patent, retropharyngeal course. Mild eccentric calcific atherosclerosis RIGHT carotid bifurcation without hemodynamically significant stenosis by NASCET criteria. RIGHT tonsillar loop. LEFT CAROTID SYSTEM: Common carotid artery is widely patent, coursing in a straight line fashion.  Retropharyngeal course. Mild eccentric intimal thickening without hemodynamically significant stenosis by NASCET criteria. VERTEBRAL ARTERIES:Left vertebral artery is dominant. Patent bilateral vertebral artery's, mild extrinsic deformity due to degenerative cervical spine. SKELETON: No acute osseous process though bone windows have not been submitted. OTHER NECK: Soft tissues of the neck are nonacute though, not tailored for evaluation. Reversed cervical lordosis with severe C5-6 and C6-7 degenerative discs, moderate at C4-5. Severe canal stenosis C5-6. Moderate to severe C5-6 and C6-7 neural foraminal narrowing. UPPER CHEST: Included lung apices are clear. No superior mediastinal lymphadenopathy. Mild suspected cardiomegaly with hazy ground-glass opacities and pulmonary vascular congestion. CTA HEAD ANTERIOR CIRCULATION: Calcific atherosclerosis resulting in severe stenosis RIGHT anterior genu of the internal carotid artery to paraophthalmic segment. 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 Moderate stenosis LEFT supraclinoid internal carotid artery. Thready RIGHT A1 segment with moderate luminal irregularity LEFT A1 segment. Patent anterior communicating artery. Bilateral anterior cerebral artery's are patent. Occluded LEFT M2 origin, superior division. Thready inferior division with  poor collateralization in pruning. Moderate stenosis RIGHT M1 segment. Severe stenosis proximal RIGHT M2 segment. No large vessel occlusion, significant stenosis, contrast extravasation or aneurysm. POSTERIOR CIRCULATION: Patent vertebral arteries, vertebrobasilar junction and basilar artery, as well as main branch vessels. Occluded RIGHT proximal P2 segment with mild collateralization. Severe stenosis LEFT P3 origin. No large vessel occlusion, significant stenosis, contrast extravasation or aneurysm. VENOUS SINUSES: Major dural venous sinuses are patent though not tailored for evaluation on this  angiographic examination. ANATOMIC VARIANTS: None. DELAYED PHASE: No abnormal intracranial enhancement. MIP images reviewed. IMPRESSION: CTA NECK: 1. Atherosclerosis without hemodynamically significant stenosis or acute vascular process in the neck. 2. Suspected cardiomegaly and pulmonary edema. Recommend chest radiograph. 3. Degenerative cervical spine resulting in severe canal stenosis C5-6. Moderate to severe C5-6 and C6-7 neural foraminal narrowing. CTA HEAD: 1. Severe stenosis RIGHT internal carotid artery at anterior genu/supraclinoid segment due to advanced atherosclerosis. 2. 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 3. Occluded RIGHT PCA at proximal P2 segment, poor collateralization and, likely acute given recent MRI findings. 4. Severe stenosis RIGHT M2 and LEFT P3 segments. Moderate stenosis RIGHT M1 segment. 5. 2 mm RIGHT PCOM origin aneurysm. Aortic Atherosclerosis (ICD10-I70.0). Electronically Signed   By: Elon Alas M.D.   On: 06/14/2017 02:28   Mr Brain Wo Contrast (neuro Protocol)  Result Date: 06/13/2017 CLINICAL DATA:  Initial evaluation for acute left leg weakness. EXAM: MRI HEAD WITHOUT CONTRAST TECHNIQUE: Multiplanar, multiecho pulse sequences of the brain and surrounding structures were obtained without intravenous contrast. COMPARISON:  Prior CT from earlier the same day. FINDINGS: Brain: Generalized age appropriate cerebral atrophy. Encephalomalacia with gliosis within the posterior left frontoparietal region compatible with remote left MCA territory infarct. Additional remote lacunar type infarcts present within the left basal ganglia. Approximate 1 cm focus of restricted diffusion at the lateral right midbrain at the level the right cortical spinal tract (series 3, image 24), consistent with an acute ischemic infarct. No associated mass effect or hemorrhage. No other evidence for acute or subacute ischemia. No other areas of chronic infarction. No other acute or chronic  intracranial hemorrhage. No mass lesion, midline shift or mass effect. No hydrocephalus. No extra-axial fluid collection. Major dural sinuses are grossly patent. Pituitary gland suprasellar region within normal limits. Vascular: Major intracranial vascular flow voids maintained. Skull and upper cervical spine: Craniocervical junction normal. Mild degenerate spondylolysis noted within the upper cervical spine without significant stenosis. Bone marrow signal intensity within normal limits. No scalp soft tissue abnormality. Sinuses/Orbits: Globes and orbital soft tissues within normal limits. Mild scattered mucosal thickening within the ethmoidal air cells and maxillary sinuses. Paranasal sinuses are otherwise clear. No mastoid effusion. Inner ear structures normal. IMPRESSION: 1. 1 cm acute ischemic nonhemorrhagic infarct involving the lateral right midbrain at the level the right corticospinal tract. No associated mass effect. 2. Remote posterior left MCA territory and left basal ganglia infarcts as above. Electronically Signed   By: Jeannine Boga M.D.   On: 06/13/2017 18:15   Mr Lumbar Spine Wo Contrast  Result Date: 06/13/2017 CLINICAL DATA:  Initial evaluation for acute back pain, leg weakness. EXAM: MRI LUMBAR SPINE WITHOUT CONTRAST TECHNIQUE: Multiplanar, multisequence MR imaging of the lumbar spine was performed. No intravenous contrast was administered. COMPARISON:  None. FINDINGS: Segmentation: Normal segmentation. Lowest well-formed disc labeled the L5-S1 level. Alignment: 4 mm anterolisthesis of L4 on L5. Trace retrolisthesis of L2 on L3 and L3 on L4. Vertebral bodies otherwise normally aligned with preservation of the normal lumbar  lordosis. Vertebrae: Vertebral body heights maintained. No evidence for acute or chronic fracture. Signal intensity within the vertebral body bone marrow within normal limits. No discrete or worrisome osseous lesions. No abnormal marrow edema. Conus medullaris:  Extends to the L1 level and appears normal. Paraspinal and other soft tissues: Paraspinous soft tissues demonstrate no acute abnormality. Fibroid uterus noted. Partially visualized visceral structures otherwise unremarkable. Disc levels: Mild diffuse congenital shortening of the pedicles noted. L1-2:  Unremarkable. L2-3: Diffuse degenerative disc bulge with disc desiccation. Superimposed left foraminal/ extraforaminal disc protrusion, contacting the exiting left L2 nerve root as it courses of the left neural foramen (series 4, image 11). No significant canal stenosis. Mild bilateral L2 foraminal stenosis related to disc bulge and short pedicles. L3-4: Diffuse disc bulge with disc desiccation and mild intervertebral disc space narrowing. Mild facet and ligamentum flavum hypertrophy. Short pedicles. Resultant moderate canal and bilateral subarticular stenosis. Mild to moderate bilateral L3 foraminal narrowing. L4-5: 4 mm anterolisthesis of L4 on L5. Associated diffuse disc bulge. Moderate bilateral facet arthrosis with ligamentum flavum hypertrophy, slightly worse on the right. Short pedicles. Severe canal and bilateral subarticular stenosis. Moderate bilateral L4 foraminal narrowing, right worse than left. L5-S1: Diffuse disc bulge with disc desiccation and intervertebral disc space narrowing. Superimposed left subarticular disc protrusion encroaches upon the left lateral recess, impinging upon the descending left S1 nerve root (series 5, image 25). Mild bilateral facet hypertrophy. Relatively mild canal narrowing. Mild left L5 foraminal stenosis present as well. IMPRESSION: 1. 4 mm anterolisthesis of L4 on L5 with associated disc bulge and moderate facet arthropathy, resulting in severe canal and bilateral subarticular stenosis, with moderate bilateral L4 foraminal narrowing. 2. Left subarticular disc protrusion at L5-S1, impinging upon the descending left S1 nerve root in the left lateral recess. 3. Left  foraminal/extraforaminal disc protrusion at L2-3, contacting and potentially irritating the exiting left L2 nerve root. 4. Diffuse congenital shortening of pedicles. 5. Fibroid uterus. Electronically Signed   By: Jeannine Boga M.D.   On: 06/13/2017 18:41   Dg Chest Port 1 View  Result Date: 06/14/2017 CLINICAL DATA:  Acute ischemic stroke.  Hypertension. EXAM: PORTABLE CHEST 1 VIEW COMPARISON:  None. FINDINGS: The heart size and mediastinal contours are within normal limits. Both lungs are clear. The visualized skeletal structures are unremarkable. IMPRESSION: No active disease. Electronically Signed   By: Earle Gell M.D.   On: 06/14/2017 07:43   Mr Jodene Nam Head Wo Contrast  Result Date: 06/13/2017 CLINICAL DATA:  Follow-up acute midbrain infarct. Recent unsteady gait. History of strokes. EXAM: MRA HEAD WITHOUT CONTRAST TECHNIQUE: Angiographic images of the Circle of Willis were obtained using MRA technique without intravenous contrast. COMPARISON:  MRI of the head June 13, 2017 at 1719 hours. FINDINGS: ANTERIOR CIRCULATION: Normal flow related enhancement of the included cervical, petrous, cavernous and supraclinoid internal carotid arteries. 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 Patent fenestrated anterior communicating artery. Patent anterior cerebral arteries, including distal segments. Occluded LEFT proximal M2 segment with paucity of mid to distal LEFT middle cerebral artery's. Severe stenosis proximal RIGHT M2 segment. Moderate luminal regularity proximal RIGHT middle cerebral artery's. POSTERIOR CIRCULATION: LEFT vertebral artery is dominant. Basilar artery is patent, with normal flow related enhancement of the main branch vessels. Occluded RIGHT proximal P2 segment with thready collaterals. Small bilateral posterior communicating artery is present. Severe stenosis proximal LEFT P3  segment. No large vessel occlusion, high-grade stenosis,  aneurysm.  ANATOMIC VARIANTS: None. Source images and MIP images were reviewed. IMPRESSION: 1. 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 2. Occluded LEFT middle cerebral artery at M2 origin with poor collateralization. 3. Severe stenosis proximal RIGHT M2 segment and proximal LEFT P3 segment. 4. Focal irregularity and probable stenosis RIGHT supraclinoid internal carotid artery. 5. 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 6. Recommend CTA for further characterization. Acute findings discussed with and reconfirmed by Dr.TIMOTHY OPYD on 06/13/2017 at 11:50 pm. Electronically Signed   By: Elon Alas M.D.   On: 06/13/2017 23:50    Assessment/Plan: Diagnosis: right midbrain/corticospinal infarct, lumbosacral spondylosis with radiculopathy 1. Does the need for close, 24 hr/day medical supervision in concert with the patient's rehab needs make it unreasonable for this patient to be served in a less intensive setting? Yes 2. Co-Morbidities requiring supervision/potential complications: htn, hx of seizure/stroke 3. Due to bladder management, bowel management, safety, skin/wound care, disease management, medication administration, pain management and patient education, does the patient require 24 hr/day rehab nursing? Yes 4. Does the patient require coordinated care of a physician, rehab nurse, PT (1-2 hrs/day, 5 days/week), OT (1-2 hrs/day, 5 days/week) and SLP (1-2 hrs/day, 5 days/week) to address physical and functional deficits in the context of the above medical diagnosis(es)? Yes Addressing deficits in the following areas: balance, endurance, locomotion, strength, transferring, bowel/bladder control, bathing, dressing, feeding, grooming, toileting, cognition, speech and psychosocial support 5. Can the patient actively participate in an intensive therapy program of at least 3 hrs of therapy per  day at least 5 days per week? Yes 6. The potential for patient to make measurable gains while on inpatient rehab is excellent 7. Anticipated functional outcomes upon discharge from inpatient rehab are modified independent  with PT, modified independent with OT, modified independent with SLP. 8. Estimated rehab length of stay to reach the above functional goals is: 13-19 days 9. Anticipated D/C setting: Home 10. Anticipated post D/C treatments: [place] therapy 11. Overall Rehab/Functional Prognosis: excellent  RECOMMENDATIONS: This patient's condition is appropriate for continued rehabilitative care in the following setting: CIR Patient has agreed to participate in recommended program. Yes Note that insurance prior authorization may be required for reimbursement for recommended care.  Comment: Rehab Admissions Coordinator to follow up.  Thanks,  Meredith Staggers, MD, [place] Pillar, PA-C 06/14/2017

## 2017-06-14 NOTE — Evaluation (Signed)
Speech Language Pathology Evaluation Patient Details Name: Leslie Simmons MRN: [id number] DOB: 02-17-1966 Today's Date: 06/14/2017 Time: 0383-3383 SLP Time Calculation (min) (ACUTE ONLY): 17 min  Problem List:  Patient Active Problem List   Diagnosis Date Noted  . Acute ischemic stroke ([place]) 06/13/2017  . Lumbar spinal stenosis 06/13/2017  . Hypertensive urgency 06/13/2017  . Essential hypertension 06/13/2017  . History of hemorrhagic stroke with residual hemiparesis ([place]) 06/13/2017  . History of seizure 06/13/2017  . Left leg weakness 06/13/2017   Past Medical History:  Past Medical History:  Diagnosis Date  . Hypertension   . Stroke [facility])    Past Surgical History: History reviewed. No pertinent surgical history. HPI:  51 y.o. female with PMH significant for hypertension, prior Left MCA stroke (2012) and left basal ganglia infarction with residual right hemiparesis presents with new onset left leg weakness and left hemisensory symptoms. MRI brain reveals an acute ischemic stroke in the right midbrain. MRA as well CTA head and neckshows severe ICAD ( occluded left MCA, R PCA, severely stenotic right ICA and right M1 stenosis).  Pt lives in [place] (with a sister) and is visiting her boyfriend in [place].   Assessment / Plan / Recommendation Clinical Impression  Pt presents with mild deficits in working memory, higher-level attention and concentration.  Speech is mildly dysarthric; output is fluent with occasional difficulty with word-finding (baseline).  Pt appropriately emotional re: new CVA after experiencing a prior stroke in 2012.  She wants to pursue rehab in [place] prior to going home to [place].     SLP Assessment  SLP Recommendation/Assessment: Patient needs continued Speech Lanaguage Pathology Services SLP Visit Diagnosis: Cognitive communication deficit (R41.841)    Follow Up Recommendations  Other (comment) (tba) pending OT/PT recs   Frequency and Duration min 1 x/week  1 week      SLP  Evaluation Cognition  Overall Cognitive Status: Within Functional Limits for tasks assessed Arousal/Alertness: Awake/alert Orientation Level: Oriented X4 Attention: Sustained Sustained Attention: Appears intact Memory: Impaired Memory Impairment: Retrieval deficit Awareness: Appears intact Safety/Judgment: Appears intact       Comprehension  Auditory Comprehension Overall Auditory Comprehension: Appears within functional limits for tasks assessed Reading Comprehension Reading Status: Not tested    Expression Expression Primary Mode of Expression: Verbal Verbal Expression Overall Verbal Expression: Other (comment) (word finding deficits at baseline) Written Expression Dominant Hand: Right (difficulty writing after 2012 stroke) Written Expression: Not tested   Oral / Motor  Oral Motor/Sensory Function Overall Oral Motor/Sensory Function: Within functional limits Motor Speech Overall Motor Speech: Appears within functional limits for tasks assessed   GO                    Leslie Simmons 06/14/2017, 10:13 AM  Leslie Simmons, Leslie Simmons Pager [phone]

## 2017-06-15 DIAGNOSIS — E785 Hyperlipidemia, unspecified: Secondary | ICD-10-CM

## 2017-06-15 DIAGNOSIS — D72829 Elevated white blood cell count, unspecified: Secondary | ICD-10-CM

## 2017-06-15 DIAGNOSIS — Z8673 Personal history of transient ischemic attack (TIA), and cerebral infarction without residual deficits: Secondary | ICD-10-CM

## 2017-06-15 LAB — CBC WITH DIFFERENTIAL/PLATELET
BASOS ABS: 0 10*3/uL (ref 0.0–0.1)
Basophils Relative: 0 %
EOS ABS: 0.4 10*3/uL (ref 0.0–0.7)
Eosinophils Relative: 4 %
HCT: 38.6 % (ref 36.0–46.0)
HEMOGLOBIN: 12.3 g/dL (ref 12.0–15.0)
Lymphocytes Relative: 35 %
Lymphs Abs: 3.5 10*3/uL (ref 0.7–4.0)
MCH: 28.5 pg (ref 26.0–34.0)
MCHC: 31.9 g/dL (ref 30.0–36.0)
MCV: 89.4 fL (ref 78.0–100.0)
Monocytes Absolute: 0.7 10*3/uL (ref 0.1–1.0)
Monocytes Relative: 7 %
NEUTROS PCT: 54 %
Neutro Abs: 5.3 10*3/uL (ref 1.7–7.7)
Platelets: 257 10*3/uL (ref 150–400)
RBC: 4.32 MIL/uL (ref 3.87–5.11)
RDW: 14.9 % (ref 11.5–15.5)
WBC: 9.9 10*3/uL (ref 4.0–10.5)

## 2017-06-15 LAB — COMPREHENSIVE METABOLIC PANEL
ALBUMIN: 3.5 g/dL (ref 3.5–5.0)
ALK PHOS: 49 U/L (ref 38–126)
ALT: 29 U/L (ref 14–54)
ANION GAP: 8 (ref 5–15)
AST: 27 U/L (ref 15–41)
BUN: 12 mg/dL (ref 6–20)
CALCIUM: 9.3 mg/dL (ref 8.9–10.3)
CO2: 23 mmol/L (ref 22–32)
CREATININE: 0.66 mg/dL (ref 0.44–1.00)
Chloride: 109 mmol/L (ref 101–111)
GFR calc Af Amer: >60 mL/min (ref >60)
GFR calc non Af Amer: >60 mL/min (ref >60)
GLUCOSE: 99 mg/dL (ref 65–99)
Potassium: 3.7 mmol/L (ref 3.5–5.1)
SODIUM: 140 mmol/L (ref 135–145)
Total Bilirubin: 0.4 mg/dL (ref 0.3–1.2)
Total Protein: 6.4 g/dL — ABNORMAL LOW (ref 6.5–8.1)

## 2017-06-15 LAB — LUPUS ANTICOAGULANT PANEL
DRVVT: 33.9 s (ref 0.0–47.0)
PTT Lupus Anticoagulant: 29.1 s (ref 0.0–51.9)

## 2017-06-15 LAB — PHOSPHORUS: PHOSPHORUS: 4.2 mg/dL (ref 2.5–4.6)

## 2017-06-15 LAB — ANTI-DNA ANTIBODY, DOUBLE-STRANDED: ds DNA Ab: <1 IU/mL (ref 0–9)

## 2017-06-15 LAB — RHEUMATOID FACTOR: Rhuematoid fact SerPl-aCnc: 13.6 IU/mL (ref 0.0–13.9)

## 2017-06-15 LAB — MAGNESIUM: Magnesium: 1.8 mg/dL (ref 1.7–2.4)

## 2017-06-15 LAB — ANTI-SMITH ANTIBODY: ENA SM Ab Ser-aCnc: <0.2 AI (ref 0.0–0.9)

## 2017-06-15 LAB — SICKLE CELL SCREEN: SICKLE CELL SCREEN: NEGATIVE

## 2017-06-15 NOTE — Progress Notes (Signed)
Occupational Therapy Treatment Patient Details Name: Leslie Simmons MRN: [id number] DOB: 11-15-1965 Today's Date: 06/15/2017    History of present illness 51 y.o. female  with PMH significant for hypertension, prior Left MCA stroke ( ?? Hemorrhagic per patient)  with residual right-sided hemiparesis presents to the emergency room for left leg weakness and back pain that began on 8.29.18 evening.MRI brain showed a right midbrain stroke. MRI of lumbar showed severe stenosis at L4-5 with grade 1 spondylolisthesis, L5-S1 herniated disc, and moderate stenosis at L3-4.    OT comments  Provided pt with built up handles to increase her independence with self feeding, writing, and grooming. Pt performed self feeding task with supported sitting, built up handles, and Min A to open apple sauce container. Pt performed AROM of BUEs, FM tasks, and writing task with built up handle. Pt highly motivated to participate in therapy; pt continued to work on writing task after session ended. Recommend dc to CIR to intense therapy to optimize safety and independence with ADLs and functional mobility.    Follow Up Recommendations  CIR;Supervision/Assistance - 24 hour    Equipment Recommendations  3 in 1 bedside commode;Other (comment) (RW)    Recommendations for Other Services      Precautions / Restrictions Precautions Precautions: Fall Precaution Comments: ck BP and pulses Restrictions Weight Bearing Restrictions: No       Mobility Bed Mobility                  Transfers                      Balance                                           ADL either performed or assessed with clinical judgement   ADL Overall ADL's : Needs assistance/impaired Eating/Feeding: Sitting;Minimal assistance Eating/Feeding Details (indicate cue type and reason): Issued built up handles. Pt performed self feed using built up handle on spoon to scoop apple sauce. Pt demonstrating increased  fucntional performance and stabilizes apple sauce contianer with L hand while self feeding with R.  Min A provided to open apple sauce container   Grooming Details (indicate cue type and reason): Discussed that built up handle may be used on grooming utensils.                                General ADL Comments: Focused on FM skills, targeted grasp, and using built up handle for self feeding, grooming, and writing.     Vision   Vision Assessment?: Vision impaired- to be further tested in functional context   Perception     Praxis      Cognition Arousal/Alertness: Awake/alert Behavior During Therapy: WFL for tasks assessed/performed Overall Cognitive Status: Within Functional Limits for tasks assessed                                 General Comments: Pt follows instructions with extra time        Exercises Exercises: Other exercises;General Upper Extremity General Exercises - Upper Extremity Shoulder Flexion: AROM;Both;10 reps;AAROM;Supine (AAROM for controlled descent) Digit Composite Flexion: AROM;Both;20 reps;Supine Composite Extension: AROM;Both;Supine (20 reps) Other Exercises Other Exercises: Pt performed FM tasks with targeted  pinch and release on small objects. Encouraged pt to perform full extension of fingers and isolate during pinch. Pt would mainly pull objects towards edge of table with flat hand and then used lateral pinch to pick up object. Pt with long finger nails, which made tasks more difficult Other Exercises: Writing task with built up handle. Pt performed connect the numbers with R hand while stabilizing paper with L hand. Continued task to focus on writing capital "T"s with goal to writting pt's name.    Shoulder Instructions       General Comments      Pertinent Vitals/ Pain       Pain Assessment: Faces Faces Pain Scale: Hurts little more Pain Location: low back Pain Descriptors / Indicators: Sore Pain Intervention(s):  Monitored during session;Limited activity within patient's tolerance;Repositioned  Home Living                                          Prior Functioning/Environment              Frequency  Min 2X/week        Progress Toward Goals  OT Goals(current goals can now be found in the care plan section)  Progress towards OT goals: Progressing toward goals  Acute Rehab OT Goals Patient Stated Goal: get stronger and get home OT Goal Formulation: With patient Time For Goal Achievement: 06/28/17 Potential to Achieve Goals: Good ADL Goals Pt Will Perform Eating: with modified independence;sitting;with adaptive utensils Pt Will Perform Grooming: with modified independence;sitting Pt Will Perform Upper Body Bathing: with modified independence;sitting Pt Will Perform Lower Body Bathing: with modified independence;sit to/from stand;with adaptive equipment Pt Will Transfer to Toilet: with modified independence;bedside commode;ambulating  Plan Discharge plan remains appropriate    Co-evaluation                 AM-PAC PT "6 Clicks" Daily Activity     Outcome Measure   Help from another person eating meals?: A Little Help from another person taking care of personal grooming?: A Little Help from another person toileting, which includes using toliet, bedpan, or urinal?: A Lot Help from another person bathing (including washing, rinsing, drying)?: A Little Help from another person to put on and taking off regular upper body clothing?: A Little Help from another person to put on and taking off regular lower body clothing?: A Lot 6 Click Score: 16    End of Session Equipment Utilized During Treatment: Gait belt;Rolling walker  OT Visit Diagnosis: Other abnormalities of gait and mobility (R26.89);Muscle weakness (generalized) (M62.81);Pain Pain - part of body:  (back)   Activity Tolerance Patient tolerated treatment well   Patient Left in chair;with call  bell/phone within reach;with family/visitor present;with chair alarm set   Nurse Communication Mobility status        Time: 6812-7517 OT Time Calculation (min): 25 min  Charges: OT General Charges $OT Visit: 1 Visit OT Treatments $Self Care/Home Management : 8-22 mins $Therapeutic Activity: 8-22 mins  Kriste Broman MSOT, OTR/L Acute Rehab Pager: [phone] Office: [place] 06/15/2017, 5:00 PM

## 2017-06-15 NOTE — Progress Notes (Signed)
PROGRESS NOTE    Leslie Simmons  DGU:[id number] DOB: 19-Nov-1965 DOA: 06/13/2017 PCP: System, Pcp Not In   Brief Narrative:  Leslie Simmons is a 51 y.o. female with medical history significant for hypertension, 2 hemorrhagic strokes with residual right-sided motor deficits, and history of seizures and other comorbids now presenting to the emergency department for evaluation of left leg weakness and low back pain. Patient reports that she had been in her usual state of health until last night when she noted the development of weakness involving the left leg. She reports that there is also some discomfort in the leg, but mainly in her low back. She has had the low back pain previously, but seems to be worsening. She has persistent right-sided weakness from her old strokes, but the left leg weakness is new as of last night. She denies any fall or trauma, denies saddle anesthesia or incontinence, and denies chest pain or palpitations. There is no headache, change in vision or hearing, or confusion.   MRI brain was performed and reveals a 1 cm acute ischemic nonhemorrhagic infarct involving the lateral right mid brain at the level of the cortical spinal tract. There is no mass effect on MRI. MRI lumbar spine was also performed and notable for severe canal stenosis at L4-5 and moderate stenosis at L3-4. Patient was treated with 325 milligram aspirin in the emergency department and neurosurgery was consulted by the ED physician. Neurosurgery evaluated patient and recommends Neurology consultation, suspecting that the leg weakness is related to the brain lesion. Neurology has been consulted and requests a medical admission. Patient was admitted for new left leg weakness with acute ischemic stroke on MRI brain and severe lumbar canal stenosis on MRI L-spine. Stroke workuped up and now patient awaiting a bed at [facility].   Assessment & Plan:   Principal Problem:   Acute ischemic stroke [facility]) Active Problems:   Lumbar spinal  stenosis   Hypertensive urgency   Essential hypertension   History of hemorrhagic stroke with residual hemiparesis (HCC)   History of seizure   Left leg weakness   History of stroke   Hyperlipidemia  1. Acute Ischemic CVA of the Lateral Midbrain  - Pt presents with left leg weakness that began the night prior  - Head CT showed No evidence of acute intracranial abnormality. Chronic left parietal and basal ganglia infarcts. - MRI brain showed 1 cm acute ischemic nonhemorrhagic infarct involving the lateral right midbrain at the level the right corticospinal tract. No associated mass effect. Remote posterior left MCA territory and left basal ganglia infarcts as above. - MRA Head w/o Contrast showed Occluded RIGHT posterior cerebral artery at P2 origin, mild collateralization. Occluded LEFT middle cerebral artery at M2 origin with poor Collateralization. Severe stenosis proximal RIGHT M2 segment and proximal LEFT P3 Segment. Focal irregularity and probable stenosis RIGHT supraclinoid internal carotid artery. 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 - CTA Head showed Severe stenosis RIGHT internal carotid artery at anterior genu/supraclinoid segment due to advanced atherosclerosis. 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 Occluded RIGHT PCA at proximal P2 segment, poor collateralization and, likely acute given recent MRI findings. Severe stenosis RIGHT M2 and LEFT P3 segments. Moderate stenosis RIGHT M1 segment. 2 mm RIGHT PCOM origin aneurysm. - CTA Neck showed Atherosclerosis without hemodynamically significant stenosis or acute vascular process in the neck. Suspected cardiomegaly and pulmonary edema. Degenerative cervical spine resulting in severe canal stenosis C5-6. Moderate to severe C5-6 and C6-7 neural foraminal narrowing -  ECHOCardiogram and showed The cavity size was normal. Wall thickness was   increased in a pattern of mild LVH. Systolic function was vigorous. The  estimated ejection fraction was in the range of 65% to 70%. - Neurology is consulted and appreciated Recc's  - Plan to continue cardiac monitoring - Lipid Panel showed Cholesterol of 141, HDL of 43, LDL of 83, TG of 74, VLDL 15 - Hemoglobin A1c was 5.9 - Hold antihypertensives and treat with prn agents if SBP >210  - C/w ASA 325 mg po Daily, Clopidogrel 75 mg po daily and Atorvastatin 80 mg po Daily - Obtain PT/OT/SLP - Neurology ordering Hypercoaguable Workup  - PT Eval recommending CIR; OT recommending [facility] - CIR consulted and patient a candidate and now awaiting Bed Placement   2. Low Back Pain and Lumbar Spinal Stenosis - MRI L-spine with severe canal stenosis at L4-5, moderate stenosis at L3-4, and herniated disc at L5-S1  - Neurosurgery is consulting and much appreciated, suspects LLE weakness secondary to the acute stroke  - No change in bowel or bladder function, and no saddle anesthesia - Continue supportive care with prn analgesia  - Neurosurgery recommending Therapy for Strengthening and Ambulation - Will need Elective Decompression and Instrument Fusion at L4-5 with possible decompression at L3-4 and L5-S1 once over acute CVA  3. Hypertension with hypertensive urgency  - BP elevated to 175/105 in ED  - Likely secondary to acute ischemic CVA  - Managed at home with Lopressor, Valsartan, and HCTZ  - Plan to hold her home antihypertensives in acute-phase of stroke, treat as needed for SBP >210   - C/w Labetalol 5 mg IV q2hprn for SBP >210 - Allow for permissive HTN  4. Seizure Hx  - Continue Lamotrigine 200 mg po Daily  5. ?Cardiomegaly and Pulmonary Edema -Seen on CTA of Neck -CXR this AM showed The heart size and mediastinal contours are within normal limits. Both lungs are clear. The visualized skeletal structures are unremarkable. -Patient undergoing ECHOCardiogram and showed - Left ventricle: The cavity size was normal. Wall thickness was increased in a pattern of  mild LVH. Systolic function was vigorous. The estimated ejection fraction was in the range of 65%   to 70%.  6. Leukocytosis -Was likely reactive and now resolved. WBC went from 12.2 -> 12.1 -> 9.9 -Repeat CBC in AM  DVT prophylaxis: Enoxaparin 40 mg sq Daily Code Status: FULL CODE Family Communication: Discussed with Husband at bedside Disposition Plan: [facility] pending Bed availability   Consultants:   Neurology  Neurosurgery   Procedures: ECHOCARDIOGRAM - Left ventricle: The cavity size was normal. Wall thickness was   increased in a pattern of mild LVH. Systolic function was   vigorous. The estimated ejection fraction was in the range of 65%   to 70%.   Antimicrobials:  Anti-infectives    None     Subjective: Seen and examined at bedside and was doing better. Wanting to do rehab in [place]. No CP or SOB. Still has weakness on Left side.    Objective: Vitals:   06/15/17 0623 06/15/17 1002 06/15/17 1418 06/15/17 1800  BP: (!) 159/85 (!) 155/107 (!) 154/89 132/87  Pulse: 65 (!) 104 92 90  Resp: 20 18 20 18   Temp: 97.7 F (36.5 C) 98.1 F (36.7 C) 98.3 F (36.8 C) 98.6 F (37 C)  TempSrc: Oral Oral Oral Oral  SpO2: 98% 99% 96% 98%  Weight:      Height:  No intake or output data in the 24 hours ending 06/15/17 2002 Filed Weights   06/13/17 6222 06/13/17 2358  Weight: 77.6 kg (171 lb) 80.5 kg (177 lb 7.5 oz)   Examination: Physical Exam:  Constitutional: Pleasant WN/WD AAF in NAD appears calm and comfortable Eyes: Sclerae anicteric; Conjuctivae non-injected ENMT: Grossly normal hearing. Mucous membranes appear moist Neck: Supple with no JVD Respiratory: CTAB; No wheezing/rales/rhonchi. Patient was not tachypenic or using any accessory muscles to breathe Cardiovascular: RRR; S1 S2. No appreciable LE edema Abdomen: Soft, NT, ND. Bowel sounds present GU: Deferred Musculoskeletal: No contractures; No cyanosis Skin: Warm and dry. No rashes or lesions on a  limited skin evaluation Neurologic: CN 2-12 grossly intact. Has weakness in Bilateral Upper extremities Left worse than Right and weakness in Left Lower extremity. Romberg sign not asseseed Psychiatric: Normal mood and affect. Intact judgement and insight  Data Reviewed: I have personally reviewed following labs and imaging studies  CBC:  Recent Labs Lab 06/13/17 1144 06/14/17 0913 06/15/17 0315  WBC 12.2* 12.1* 9.9  NEUTROABS 8.2* 8.0* 5.3  HGB 13.0 12.1 12.3  HCT 40.7 37.8 38.6  MCV 90.0 89.8 89.4  PLT 303 280 979   Basic Metabolic Panel:  Recent Labs Lab 06/13/17 1144 06/14/17 0913 06/15/17 0315  NA 139 141 140  K 4.0 3.6 3.7  CL 106 110 109  CO2 23 23 23   GLUCOSE 101* 101* 99  BUN 9 9 12   CREATININE 0.62 0.56 0.66  CALCIUM 10.2 9.2 9.3  MG  --  1.7 1.8  PHOS  --  3.9 4.2   GFR: Estimated Creatinine Clearance: 77.3 mL/min (by C-G formula based on SCr of 0.66 mg/dL). Liver Function Tests:  Recent Labs Lab 06/13/17 1144 06/14/17 0913 06/15/17 0315  AST 31 26 27   ALT 38 32 29  ALKPHOS 61 52 49  BILITOT 0.4 0.5 0.4  PROT 7.6 6.8 6.4*  ALBUMIN 3.9 3.4* 3.5   No results for input(s): LIPASE, AMYLASE in the last 168 hours. No results for input(s): AMMONIA in the last 168 hours. Coagulation Profile:  Recent Labs Lab 06/13/17 1144  INR 0.94   Cardiac Enzymes: No results for input(s): CKTOTAL, CKMB, CKMBINDEX, TROPONINI in the last 168 hours. BNP (last 3 results) No results for input(s): PROBNP in the last 8760 hours. HbA1C:  Recent Labs  06/14/17 0534  HGBA1C 5.9*   CBG: No results for input(s): GLUCAP in the last 168 hours. Lipid Profile:  Recent Labs  06/14/17 0534  CHOL 141  HDL 43  LDLCALC 83  TRIG 74  CHOLHDL 3.3   Thyroid Function Tests: No results for input(s): TSH, T4TOTAL, FREET4, T3FREE, THYROIDAB in the last 72 hours. Anemia Panel: No results for input(s): VITAMINB12, FOLATE, FERRITIN, TIBC, IRON, RETICCTPCT in the last 72  hours. Sepsis Labs: No results for input(s): PROCALCITON, LATICACIDVEN in the last 168 hours.  No results found for this or any previous visit (from the past 240 hour(s)).   Radiology Studies: Ct Angio Head W Or Wo Contrast  Result Date: 06/14/2017 CLINICAL DATA:  Follow-up stroke and cerebral artery occlusion. EXAM: CT ANGIOGRAPHY HEAD AND NECK TECHNIQUE: Multidetector CT imaging of the head and neck was performed using the standard protocol during bolus administration of intravenous contrast. Multiplanar CT image reconstructions and MIPs were obtained to evaluate the vascular anatomy. Carotid stenosis measurements (when applicable) are obtained utilizing NASCET criteria, using the distal internal carotid diameter as the denominator. CONTRAST:  50 cc Isovue 370 COMPARISON:  MRI and MRA head June 13, 2017 FINDINGS: CTA NECK AORTIC ARCH: Normal appearance of the thoracic arch, 2 vessel arch is a normal variant. Mild calcific atherosclerosis aortic arch. Intimal thickening resulting in moderate stenosis RIGHT subclavian artery origin. RIGHT CAROTID SYSTEM: Common carotid artery is widely patent, retropharyngeal course. Mild eccentric calcific atherosclerosis RIGHT carotid bifurcation without hemodynamically significant stenosis by NASCET criteria. RIGHT tonsillar loop. LEFT CAROTID SYSTEM: Common carotid artery is widely patent, coursing in a straight line fashion. Retropharyngeal course. Mild eccentric intimal thickening without hemodynamically significant stenosis by NASCET criteria. VERTEBRAL ARTERIES:Left vertebral artery is dominant. Patent bilateral vertebral artery's, mild extrinsic deformity due to degenerative cervical spine. SKELETON: No acute osseous process though bone windows have not been submitted. OTHER NECK: Soft tissues of the neck are nonacute though, not tailored for evaluation. Reversed cervical lordosis with severe C5-6 and C6-7 degenerative discs, moderate at C4-5. Severe canal  stenosis C5-6. Moderate to severe C5-6 and C6-7 neural foraminal narrowing. UPPER CHEST: Included lung apices are clear. No superior mediastinal lymphadenopathy. Mild suspected cardiomegaly with hazy ground-glass opacities and pulmonary vascular congestion. CTA HEAD ANTERIOR CIRCULATION: Calcific atherosclerosis resulting in severe stenosis RIGHT anterior genu of the internal carotid artery to paraophthalmic segment. 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 Moderate stenosis LEFT supraclinoid internal carotid artery. Thready RIGHT A1 segment with moderate luminal irregularity LEFT A1 segment. Patent anterior communicating artery. Bilateral anterior cerebral artery's are patent. Occluded LEFT M2 origin, superior division. Thready inferior division with poor collateralization in pruning. Moderate stenosis RIGHT M1 segment. Severe stenosis proximal RIGHT M2 segment. No large vessel occlusion, significant stenosis, contrast extravasation or aneurysm. POSTERIOR CIRCULATION: Patent vertebral arteries, vertebrobasilar junction and basilar artery, as well as main branch vessels. Occluded RIGHT proximal P2 segment with mild collateralization. Severe stenosis LEFT P3 origin. No large vessel occlusion, significant stenosis, contrast extravasation or aneurysm. VENOUS SINUSES: Major dural venous sinuses are patent though not tailored for evaluation on this angiographic examination. ANATOMIC VARIANTS: None. DELAYED PHASE: No abnormal intracranial enhancement. MIP images reviewed. IMPRESSION: CTA NECK: 1. Atherosclerosis without hemodynamically significant stenosis or acute vascular process in the neck. 2. Suspected cardiomegaly and pulmonary edema. Recommend chest radiograph. 3. Degenerative cervical spine resulting in severe canal stenosis C5-6. Moderate to severe C5-6 and C6-7 neural foraminal narrowing. CTA HEAD: 1. Severe stenosis RIGHT internal carotid artery at anterior genu/supraclinoid  segment due to advanced atherosclerosis. 2. 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 3. Occluded RIGHT PCA at proximal P2 segment, poor collateralization and, likely acute given recent MRI findings. 4. Severe stenosis RIGHT M2 and LEFT P3 segments. Moderate stenosis RIGHT M1 segment. 5. 2 mm RIGHT PCOM origin aneurysm. Aortic Atherosclerosis (ICD10-I70.0). Electronically Signed   By: Elon Alas M.D.   On: 06/14/2017 02:28   Ct Angio Neck W Or Wo Contrast  Result Date: 06/14/2017 CLINICAL DATA:  Follow-up stroke and cerebral artery occlusion. EXAM: CT ANGIOGRAPHY HEAD AND NECK TECHNIQUE: Multidetector CT imaging of the head and neck was performed using the standard protocol during bolus administration of intravenous contrast. Multiplanar CT image reconstructions and MIPs were obtained to evaluate the vascular anatomy. Carotid stenosis measurements (when applicable) are obtained utilizing NASCET criteria, using the distal internal carotid diameter as the denominator. CONTRAST:  50 cc Isovue 370 COMPARISON:  MRI and MRA head June 13, 2017 FINDINGS: CTA NECK AORTIC ARCH: Normal appearance of the thoracic arch, 2 vessel arch is a normal variant. Mild calcific atherosclerosis aortic arch. Intimal thickening resulting in  moderate stenosis RIGHT subclavian artery origin. RIGHT CAROTID SYSTEM: Common carotid artery is widely patent, retropharyngeal course. Mild eccentric calcific atherosclerosis RIGHT carotid bifurcation without hemodynamically significant stenosis by NASCET criteria. RIGHT tonsillar loop. LEFT CAROTID SYSTEM: Common carotid artery is widely patent, coursing in a straight line fashion. Retropharyngeal course. Mild eccentric intimal thickening without hemodynamically significant stenosis by NASCET criteria. VERTEBRAL ARTERIES:Left vertebral artery is dominant. Patent bilateral vertebral artery's, mild extrinsic deformity due to degenerative cervical spine. SKELETON: No acute osseous  process though bone windows have not been submitted. OTHER NECK: Soft tissues of the neck are nonacute though, not tailored for evaluation. Reversed cervical lordosis with severe C5-6 and C6-7 degenerative discs, moderate at C4-5. Severe canal stenosis C5-6. Moderate to severe C5-6 and C6-7 neural foraminal narrowing. UPPER CHEST: Included lung apices are clear. No superior mediastinal lymphadenopathy. Mild suspected cardiomegaly with hazy ground-glass opacities and pulmonary vascular congestion. CTA HEAD ANTERIOR CIRCULATION: Calcific atherosclerosis resulting in severe stenosis RIGHT anterior genu of the internal carotid artery to paraophthalmic segment. 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 Moderate stenosis LEFT supraclinoid internal carotid artery. Thready RIGHT A1 segment with moderate luminal irregularity LEFT A1 segment. Patent anterior communicating artery. Bilateral anterior cerebral artery's are patent. Occluded LEFT M2 origin, superior division. Thready inferior division with poor collateralization in pruning. Moderate stenosis RIGHT M1 segment. Severe stenosis proximal RIGHT M2 segment. No large vessel occlusion, significant stenosis, contrast extravasation or aneurysm. POSTERIOR CIRCULATION: Patent vertebral arteries, vertebrobasilar junction and basilar artery, as well as main branch vessels. Occluded RIGHT proximal P2 segment with mild collateralization. Severe stenosis LEFT P3 origin. No large vessel occlusion, significant stenosis, contrast extravasation or aneurysm. VENOUS SINUSES: Major dural venous sinuses are patent though not tailored for evaluation on this angiographic examination. ANATOMIC VARIANTS: None. DELAYED PHASE: No abnormal intracranial enhancement. MIP images reviewed. IMPRESSION: CTA NECK: 1. Atherosclerosis without hemodynamically significant stenosis or acute vascular process in the neck. 2. Suspected cardiomegaly and pulmonary edema.  Recommend chest radiograph. 3. Degenerative cervical spine resulting in severe canal stenosis C5-6. Moderate to severe C5-6 and C6-7 neural foraminal narrowing. CTA HEAD: 1. Severe stenosis RIGHT internal carotid artery at anterior genu/supraclinoid segment due to advanced atherosclerosis. 2. 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 3. Occluded RIGHT PCA at proximal P2 segment, poor collateralization and, likely acute given recent MRI findings. 4. Severe stenosis RIGHT M2 and LEFT P3 segments. Moderate stenosis RIGHT M1 segment. 5. 2 mm RIGHT PCOM origin aneurysm. Aortic Atherosclerosis (ICD10-I70.0). Electronically Signed   By: Elon Alas M.D.   On: 06/14/2017 02:28   Dg Chest Port 1 View  Result Date: 06/14/2017 CLINICAL DATA:  Acute ischemic stroke.  Hypertension. EXAM: PORTABLE CHEST 1 VIEW COMPARISON:  None. FINDINGS: The heart size and mediastinal contours are within normal limits. Both lungs are clear. The visualized skeletal structures are unremarkable. IMPRESSION: No active disease. Electronically Signed   By: Earle Gell M.D.   On: 06/14/2017 07:43   Mr Jodene Nam Head Wo Contrast  Result Date: 06/13/2017 CLINICAL DATA:  Follow-up acute midbrain infarct. Recent unsteady gait. History of strokes. EXAM: MRA HEAD WITHOUT CONTRAST TECHNIQUE: Angiographic images of the Circle of Willis were obtained using MRA technique without intravenous contrast. COMPARISON:  MRI of the head June 13, 2017 at 1719 hours. FINDINGS: ANTERIOR CIRCULATION: Normal flow related enhancement of the included cervical, petrous, cavernous and supraclinoid internal carotid arteries. Focal luminal irregularity RIGHT supraclinoid internal carotid artery with 3 mm inferiorly directed aneurysm at posterior  communicating artery origin. Patent fenestrated anterior communicating artery. Patent anterior cerebral arteries, including distal segments. Occluded LEFT proximal M2 segment with paucity of mid to distal LEFT middle  cerebral artery's. Severe stenosis proximal RIGHT M2 segment. Moderate luminal regularity proximal RIGHT middle cerebral artery's. POSTERIOR CIRCULATION: LEFT vertebral artery is dominant. Basilar artery is patent, with normal flow related enhancement of the main branch vessels. Occluded RIGHT proximal P2 segment with thready collaterals. Small bilateral posterior communicating artery is present. Severe stenosis proximal LEFT P3 segment. No large vessel occlusion, high-grade stenosis,  aneurysm. ANATOMIC VARIANTS: None. Source images and MIP images were reviewed. IMPRESSION: 1. 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 2. Occluded LEFT middle cerebral artery at M2 origin with poor collateralization. 3. Severe stenosis proximal RIGHT M2 segment and proximal LEFT P3 segment. 4. Focal irregularity and probable stenosis RIGHT supraclinoid internal carotid artery. 5. 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 6. Recommend CTA for further characterization. Acute findings discussed with and reconfirmed by Dr.TIMOTHY OPYD on 06/13/2017 at 11:50 pm. Electronically Signed   By: Elon Alas M.D.   On: 06/13/2017 23:50   Scheduled Meds: . aspirin EC  325 mg Oral Daily  . atorvastatin  80 mg Oral q1800  . clopidogrel  75 mg Oral Daily  . docusate sodium  100 mg Oral Daily  . enoxaparin (LOVENOX) injection  40 mg Subcutaneous Daily  . ferrous sulfate  325 mg Oral Q breakfast  . lamoTRIgine  200 mg Oral Daily  . vitamin B-12  1,000 mcg Oral Daily  . zolpidem  5 mg Oral QHS   Continuous Infusions:   LOS: 2 days   Kerney Elbe, DO [facility] Pager [phone]  If 7PM-7AM, please contact night-coverage [email or web address] Password [email or web address] 06/15/2017, 8:02 PM

## 2017-06-16 DIAGNOSIS — K5900 Constipation, unspecified: Secondary | ICD-10-CM

## 2017-06-16 LAB — BETA-2-GLYCOPROTEIN I ABS, IGG/M/A: Beta-2-Glycoprotein I IgA: <9 GPI IgA units (ref 0–25)

## 2017-06-16 LAB — GLUCOSE, CAPILLARY: Glucose-Capillary: 146 mg/dL — ABNORMAL HIGH (ref 65–99)

## 2017-06-16 MED ORDER — POLYETHYLENE GLYCOL 3350 17 G PO PACK
17.0000 g | PACK | Freq: Two times a day (BID) | ORAL | Status: DC
  Administered 2017-06-16 – 2017-06-17 (×3): 17 g via ORAL
  Filled 2017-06-16 (×3): qty 1

## 2017-06-16 MED ORDER — DIPHENHYDRAMINE HCL 25 MG PO CAPS
25.0000 mg | ORAL_CAPSULE | Freq: Four times a day (QID) | ORAL | Status: DC | PRN
Start: 2017-06-16 — End: 2017-06-17
  Administered 2017-06-16: 25 mg via ORAL
  Filled 2017-06-16: qty 1

## 2017-06-16 MED ORDER — BISACODYL 10 MG RE SUPP
10.0000 mg | Freq: Once | RECTAL | Status: AC
  Administered 2017-06-16: 10 mg via RECTAL
  Filled 2017-06-16: qty 1

## 2017-06-16 NOTE — Progress Notes (Signed)
PROGRESS NOTE    Leslie Simmons  BJS:[id number] DOB: May 12, 1966 DOA: 06/13/2017 PCP: System, Pcp Not In   Brief Narrative:  Leslie Simmons is a 51 y.o. female with medical history significant for hypertension, 2 hemorrhagic strokes with residual right-sided motor deficits, and history of seizures and other comorbids now presenting to the emergency department for evaluation of left leg weakness and low back pain. Patient reports that she had been in her usual state of health until last night when she noted the development of weakness involving the left leg. She reports that there is also some discomfort in the leg, but mainly in her low back. She has had the low back pain previously, but seems to be worsening. She has persistent right-sided weakness from her old strokes, but the left leg weakness is new as of last night. She denies any fall or trauma, denies saddle anesthesia or incontinence, and denies chest pain or palpitations. There is no headache, change in vision or hearing, or confusion.   MRI brain was performed and reveals a 1 cm acute ischemic nonhemorrhagic infarct involving the lateral right mid brain at the level of the cortical spinal tract. There is no mass effect on MRI. MRI lumbar spine was also performed and notable for severe canal stenosis at L4-5 and moderate stenosis at L3-4. Patient was treated with 325 milligram aspirin in the emergency department and neurosurgery was consulted by the ED physician. Neurosurgery evaluated patient and recommends Neurology consultation, suspecting that the leg weakness is related to the brain lesion. Neurology has been consulted and requests a medical admission. Patient was admitted for new left leg weakness with acute ischemic stroke on MRI brain and severe lumbar canal stenosis on MRI L-spine. Stroke workuped up and now patient awaiting a bed at [facility].   Assessment & Plan:   Principal Problem:   Acute ischemic stroke [facility]) Active Problems:   Lumbar spinal  stenosis   Hypertensive urgency   Essential hypertension   History of hemorrhagic stroke with residual hemiparesis (HCC)   History of seizure   Left leg weakness   History of stroke   Hyperlipidemia  1. Acute Ischemic CVA of the Lateral Midbrain  - Pt presents with left leg weakness that began the night prior  - Head CT showed No evidence of acute intracranial abnormality. Chronic left parietal and basal ganglia infarcts. - MRI brain showed 1 cm acute ischemic nonhemorrhagic infarct involving the lateral right midbrain at the level the right corticospinal tract. No associated mass effect. Remote posterior left MCA territory and left basal ganglia infarcts as above. - MRA Head w/o Contrast showed Occluded RIGHT posterior cerebral artery at P2 origin, mild collateralization. Occluded LEFT middle cerebral artery at M2 origin with poor Collateralization. Severe stenosis proximal RIGHT M2 segment and proximal LEFT P3 Segment. Focal irregularity and probable stenosis RIGHT supraclinoid internal carotid artery. 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 - CTA Head showed Severe stenosis RIGHT internal carotid artery at anterior genu/supraclinoid segment due to advanced atherosclerosis. 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 Occluded RIGHT PCA at proximal P2 segment, poor collateralization and, likely acute given recent MRI findings. Severe stenosis RIGHT M2 and LEFT P3 segments. Moderate stenosis RIGHT M1 segment. 2 mm RIGHT PCOM origin aneurysm. - CTA Neck showed Atherosclerosis without hemodynamically significant stenosis or acute vascular process in the neck. Suspected cardiomegaly and pulmonary edema. Degenerative cervical spine resulting in severe canal stenosis C5-6. Moderate to severe C5-6 and C6-7 neural foraminal narrowing -  ECHOCardiogram and showed The cavity size was normal. Wall thickness was   increased in a pattern of mild LVH. Systolic function was vigorous. The  estimated ejection fraction was in the range of 65% to 70%. - Neurology is consulted and appreciated Recc's  - Plan to continue cardiac monitoring - Lipid Panel showed Cholesterol of 141, HDL of 43, LDL of 83, TG of 74, VLDL 15 - Hemoglobin A1c was 5.9 - Hold antihypertensives and treat with prn agents if SBP >210  - C/w ASA 325 mg po Daily, Clopidogrel 75 mg po daily and Atorvastatin 80 mg po Daily - Obtain PT/OT/SLP - Neurology ordering Hypercoaguable Workup  - PT Eval recommending CIR; OT recommending CIR - CIR consulted and patient a candidate and now awaiting Bed Placement   2. Low Back Pain and Lumbar Spinal Stenosis - MRI L-spine with severe canal stenosis at L4-5, moderate stenosis at L3-4, and herniated disc at L5-S1  - Neurosurgery is consulting and much appreciated, suspects LLE weakness secondary to the acute stroke  - No change in bowel or bladder function, and no saddle anesthesia - Continue supportive care with prn analgesia  - Neurosurgery recommending Therapy for Strengthening and Ambulation - Will need Elective Decompression and Instrument Fusion at L4-5 with possible decompression at L3-4 and L5-S1 once over acute CVA  3. Hypertension with hypertensive urgency  - BP elevated to 175/105 in ED - Likely secondary to acute ischemic CVA  - Managed at home with Lopressor, Valsartan, and HCTZ  - Plan to hold her home antihypertensives in acute-phase of stroke, treat as needed for SBP >210   - C/w Labetalol 5 mg IV q2hprn for SBP >210 - Allow for permissive HTN and BP now 165/90  4. Seizure Hx  - Continue Lamotrigine 200 mg po Daily  5. ?Cardiomegaly and Pulmonary Edema -Seen on CTA of Neck -CXR this AM showed The heart size and mediastinal contours are within normal limits. Both lungs are clear. The visualized skeletal structures are unremarkable. -Patient undergoing ECHOCardiogram and showed - Left ventricle: The cavity size was normal. Wall thickness was increased  in a pattern of mild LVH. Systolic function was vigorous. The estimated ejection fraction was in the range of 65%   to 70%.  6. Leukocytosis -Was likely reactive and now resolved. WBC went from 12.2 -> 12.1 -> 9.9 -Did not Repeat CBC today but will in in AM  7. Constipation -Patient has not had a bowel movement in 4 days -C/w Docusate 100 mg po Daily and Senna-Docusate 1 tab po qHSprn -Added Miralax 17 grams po BID -Given Bisacodyl Suppository 10 mg RC once   DVT prophylaxis: Enoxaparin 40 mg sq Daily Code Status: FULL CODE Family Communication: No family present at bedside Disposition Plan: CIR pending Bed availability   Consultants:   Neurology  Neurosurgery   Procedures: ECHOCARDIOGRAM - Left ventricle: The cavity size was normal. Wall thickness was   increased in a pattern of mild LVH. Systolic function was   vigorous. The estimated ejection fraction was in the range of 65%   to 70%.   Antimicrobials:  Anti-infectives    None     Subjective: Seen and examined at beside and was doing well and stated that she sleeps better when she takes the Ambien. Wants to do rehab and get better. No nausea or vomiting. No CP or SOB. No other concerns or complaints at this time.   Objective: Vitals:   06/15/17 2114 06/16/17 0133 06/16/17 0540 06/16/17 0630  BP: 124/79 (!) 116/92 (!) 144/87 (!) 165/90  Pulse: 86 82 89 (!) 103  Resp: 18 18 18 20   Temp: 99 F (37.2 C) 99 F (37.2 C) 99 F (37.2 C) 98.4 F (36.9 C)  TempSrc: Oral Oral Oral Oral  SpO2: 97% 97% 98% 97%  Weight:      Height:       No intake or output data in the 24 hours ending 06/16/17 1440 Filed Weights   06/13/17 0959 06/13/17 2358  Weight: 77.6 kg (171 lb) 80.5 kg (177 lb 7.5 oz)   Examination: Physical Exam:  Constitutional: Pleasant WN/WD AAF in NAD sitting in chair bedside Eyes: Sclerae anicteric; Conjunctivae non-injected ENMT: Grossly normal hearing. Mucous membranes appear moist Neck: Supple  with no JVD Respiratory: CTAB; No wheezing/rales/rhonchi. Patient was not tachypenic or using any accessory muscles to breathe Cardiovascular: RRR; S1 S2, No LE Edema Abdomen: Soft, NT, ND. Bowel sounds present GU: Deferred Musculoskeletal: No contractures; No cyanosis Skin: Warm and Dry. No Rashes or lesions on a limited skin evaluation Neurologic: CN 2-12 grossly intact. Had bilateral UE weakness with Left worse than right and Left lower weakness with diminished sensation in Left Leg Psychiatric: Pleasant mood and affect. Intact judgement and insight  Data Reviewed: I have personally reviewed following labs and imaging studies  CBC:  Recent Labs Lab 06/13/17 1144 06/14/17 0913 06/15/17 0315  WBC 12.2* 12.1* 9.9  NEUTROABS 8.2* 8.0* 5.3  HGB 13.0 12.1 12.3  HCT 40.7 37.8 38.6  MCV 90.0 89.8 89.4  PLT 303 280 161   Basic Metabolic Panel:  Recent Labs Lab 06/13/17 1144 06/14/17 0913 06/15/17 0315  NA 139 141 140  K 4.0 3.6 3.7  CL 106 110 109  CO2 23 23 23   GLUCOSE 101* 101* 99  BUN 9 9 12   CREATININE 0.62 0.56 0.66  CALCIUM 10.2 9.2 9.3  MG  --  1.7 1.8  PHOS  --  3.9 4.2   GFR: Estimated Creatinine Clearance: 77.3 mL/min (by C-G formula based on SCr of 0.66 mg/dL). Liver Function Tests:  Recent Labs Lab 06/13/17 1144 06/14/17 0913 06/15/17 0315  AST 31 26 27   ALT 38 32 29  ALKPHOS 61 52 49  BILITOT 0.4 0.5 0.4  PROT 7.6 6.8 6.4*  ALBUMIN 3.9 3.4* 3.5   No results for input(s): LIPASE, AMYLASE in the last 168 hours. No results for input(s): AMMONIA in the last 168 hours. Coagulation Profile:  Recent Labs Lab 06/13/17 1144  INR 0.94   Cardiac Enzymes: No results for input(s): CKTOTAL, CKMB, CKMBINDEX, TROPONINI in the last 168 hours. BNP (last 3 results) No results for input(s): PROBNP in the last 8760 hours. HbA1C:  Recent Labs  06/14/17 0534  HGBA1C 5.9*   CBG:  Recent Labs Lab 06/16/17 1225  GLUCAP 146*   Lipid  Profile:  Recent Labs  06/14/17 0534  CHOL 141  HDL 43  LDLCALC 83  TRIG 74  CHOLHDL 3.3   Thyroid Function Tests: No results for input(s): TSH, T4TOTAL, FREET4, T3FREE, THYROIDAB in the last 72 hours. Anemia Panel: No results for input(s): VITAMINB12, FOLATE, FERRITIN, TIBC, IRON, RETICCTPCT in the last 72 hours. Sepsis Labs: No results for input(s): PROCALCITON, LATICACIDVEN in the last 168 hours.  No results found for this or any previous visit (from the past 240 hour(s)).   Radiology Studies: No results found. Scheduled Meds: . aspirin EC  325 mg Oral Daily  . atorvastatin  80 mg Oral q1800  .  clopidogrel  75 mg Oral Daily  . docusate sodium  100 mg Oral Daily  . enoxaparin (LOVENOX) injection  40 mg Subcutaneous Daily  . ferrous sulfate  325 mg Oral Q breakfast  . lamoTRIgine  200 mg Oral Daily  . polyethylene glycol  17 g Oral BID  . vitamin B-12  1,000 mcg Oral Daily  . zolpidem  5 mg Oral QHS   Continuous Infusions:   LOS: 3 days   Kerney Elbe, DO [facility] Pager [phone]  If 7PM-7AM, please contact night-coverage [email or web address] Password [email or web address] 06/16/2017, 2:40 PM

## 2017-06-17 ENCOUNTER — Inpatient Hospital Stay (HOSPITAL_COMMUNITY)
Admission: RE | Admit: 2017-06-17 | Discharge: 2017-06-24 | DRG: 057 | Disposition: A | Discharge status: Home Health | Payer: Medicare Other | Source: Intra-hospital | Attending: Physical Medicine & Rehabilitation | Admitting: Physical Medicine & Rehabilitation

## 2017-06-17 ENCOUNTER — Encounter (HOSPITAL_COMMUNITY): Payer: Self-pay

## 2017-06-17 DIAGNOSIS — K5900 Constipation, unspecified: Secondary | ICD-10-CM | POA: Diagnosis present

## 2017-06-17 DIAGNOSIS — M5441 Lumbago with sciatica, right side: Secondary | ICD-10-CM | POA: Diagnosis present

## 2017-06-17 DIAGNOSIS — G40909 Epilepsy, unspecified, not intractable, without status epilepticus: Secondary | ICD-10-CM | POA: Diagnosis present

## 2017-06-17 DIAGNOSIS — I69351 Hemiplegia and hemiparesis following cerebral infarction affecting right dominant side: Secondary | ICD-10-CM | POA: Diagnosis present

## 2017-06-17 DIAGNOSIS — Z888 Allergy status to other drugs, medicaments and biological substances status: Secondary | ICD-10-CM

## 2017-06-17 DIAGNOSIS — Z79899 Other long term (current) drug therapy: Secondary | ICD-10-CM

## 2017-06-17 DIAGNOSIS — F4322 Adjustment disorder with anxiety: Secondary | ICD-10-CM | POA: Diagnosis present

## 2017-06-17 DIAGNOSIS — F5101 Primary insomnia: Secondary | ICD-10-CM

## 2017-06-17 DIAGNOSIS — M79652 Pain in left thigh: Secondary | ICD-10-CM | POA: Diagnosis not present

## 2017-06-17 DIAGNOSIS — I69322 Dysarthria following cerebral infarction: Secondary | ICD-10-CM

## 2017-06-17 DIAGNOSIS — Z7982 Long term (current) use of aspirin: Secondary | ICD-10-CM | POA: Diagnosis not present

## 2017-06-17 DIAGNOSIS — M48061 Spinal stenosis, lumbar region without neurogenic claudication: Secondary | ICD-10-CM | POA: Diagnosis present

## 2017-06-17 DIAGNOSIS — I63431 Cerebral infarction due to embolism of right posterior cerebral artery: Secondary | ICD-10-CM | POA: Diagnosis present

## 2017-06-17 DIAGNOSIS — M5440 Lumbago with sciatica, unspecified side: Secondary | ICD-10-CM

## 2017-06-17 DIAGNOSIS — I69311 Memory deficit following cerebral infarction: Secondary | ICD-10-CM

## 2017-06-17 DIAGNOSIS — R6510 Systemic inflammatory response syndrome (SIRS) of non-infectious origin without acute organ dysfunction: Secondary | ICD-10-CM | POA: Diagnosis not present

## 2017-06-17 DIAGNOSIS — D72829 Elevated white blood cell count, unspecified: Secondary | ICD-10-CM | POA: Diagnosis not present

## 2017-06-17 DIAGNOSIS — E785 Hyperlipidemia, unspecified: Secondary | ICD-10-CM | POA: Diagnosis present

## 2017-06-17 DIAGNOSIS — M5117 Intervertebral disc disorders with radiculopathy, lumbosacral region: Secondary | ICD-10-CM | POA: Diagnosis present

## 2017-06-17 DIAGNOSIS — Z885 Allergy status to narcotic agent status: Secondary | ICD-10-CM

## 2017-06-17 DIAGNOSIS — M5416 Radiculopathy, lumbar region: Secondary | ICD-10-CM

## 2017-06-17 DIAGNOSIS — R569 Unspecified convulsions: Secondary | ICD-10-CM

## 2017-06-17 DIAGNOSIS — I10 Essential (primary) hypertension: Secondary | ICD-10-CM

## 2017-06-17 DIAGNOSIS — R7303 Prediabetes: Secondary | ICD-10-CM | POA: Diagnosis present

## 2017-06-17 DIAGNOSIS — M4727 Other spondylosis with radiculopathy, lumbosacral region: Secondary | ICD-10-CM | POA: Diagnosis present

## 2017-06-17 DIAGNOSIS — G8929 Other chronic pain: Secondary | ICD-10-CM | POA: Diagnosis present

## 2017-06-17 DIAGNOSIS — Z823 Family history of stroke: Secondary | ICD-10-CM | POA: Diagnosis not present

## 2017-06-17 DIAGNOSIS — R000 Tachycardia, unspecified: Secondary | ICD-10-CM | POA: Diagnosis not present

## 2017-06-17 LAB — HOMOCYSTEINE: HOMOCYSTEINE-NORM: 6.2 umol/L (ref 0.0–15.0)

## 2017-06-17 LAB — CARDIOLIPIN ANTIBODIES, IGG, IGM, IGA
Anticardiolipin IgG: <9 GPL U/mL (ref 0–14)
Anticardiolipin IgM: <9 MPL U/mL (ref 0–12)

## 2017-06-17 LAB — ANCA TITERS: C-ANCA: <1:20 {titer}

## 2017-06-17 MED ORDER — TRAZODONE HCL 50 MG PO TABS
25.0000 mg | ORAL_TABLET | Freq: Every evening | ORAL | Status: DC | PRN
  Administered 2017-06-19 – 2017-06-20 (×2): 50 mg via ORAL
  Filled 2017-06-17 (×3): qty 1

## 2017-06-17 MED ORDER — POLYETHYLENE GLYCOL 3350 17 G PO PACK
17.0000 g | PACK | Freq: Two times a day (BID) | ORAL | Status: DC
  Administered 2017-06-17 – 2017-06-20 (×5): 17 g via ORAL
  Filled 2017-06-17 (×6): qty 1

## 2017-06-17 MED ORDER — ALUM & MAG HYDROXIDE-SIMETH 200-200-20 MG/5ML PO SUSP: 30.0000 mL | ORAL | Status: DC | PRN

## 2017-06-17 MED ORDER — SENNOSIDES-DOCUSATE SODIUM 8.6-50 MG PO TABS: 1.0000 | ORAL_TABLET | Freq: Every evening | ORAL | 0 refills | Status: DC | PRN

## 2017-06-17 MED ORDER — CLOPIDOGREL BISULFATE 75 MG PO TABS: 75.0000 mg | ORAL_TABLET | Freq: Every day | ORAL | 0 refills | Status: DC

## 2017-06-17 MED ORDER — PROCHLORPERAZINE EDISYLATE 5 MG/ML IJ SOLN: 5.0000 mg | Freq: Four times a day (QID) | INTRAMUSCULAR | Status: DC | PRN

## 2017-06-17 MED ORDER — ZOLPIDEM TARTRATE 5 MG PO TABS
5.0000 mg | ORAL_TABLET | Freq: Every day | ORAL | Status: DC
  Administered 2017-06-17 – 2017-06-23 (×7): 5 mg via ORAL
  Filled 2017-06-17 (×7): qty 1

## 2017-06-17 MED ORDER — ASPIRIN EC 325 MG PO TBEC
325.0000 mg | DELAYED_RELEASE_TABLET | Freq: Every day | ORAL | Status: DC
  Administered 2017-06-18 – 2017-06-24 (×7): 325 mg via ORAL
  Filled 2017-06-17 (×7): qty 1

## 2017-06-17 MED ORDER — FLEET ENEMA 7-19 GM/118ML RE ENEM
1.0000 | ENEMA | Freq: Once | RECTAL | Status: DC | PRN
  Filled 2017-06-17: qty 1

## 2017-06-17 MED ORDER — LAMOTRIGINE 100 MG PO TABS
200.0000 mg | ORAL_TABLET | Freq: Every day | ORAL | Status: DC
  Administered 2017-06-18 – 2017-06-24 (×7): 200 mg via ORAL
  Filled 2017-06-17: qty 2
  Filled 2017-06-17: qty 8
  Filled 2017-06-17 (×6): qty 2

## 2017-06-17 MED ORDER — POLYETHYLENE GLYCOL 3350 17 G PO PACK: 17.0000 g | PACK | Freq: Two times a day (BID) | ORAL | 0 refills | Status: DC

## 2017-06-17 MED ORDER — ENOXAPARIN SODIUM 40 MG/0.4ML ~~LOC~~ SOLN
40.0000 mg | SUBCUTANEOUS | Status: DC
  Filled 2017-06-17: qty 0.4

## 2017-06-17 MED ORDER — VITAMIN B-12 1000 MCG PO TABS
1000.0000 ug | ORAL_TABLET | Freq: Every day | ORAL | Status: DC
  Administered 2017-06-18 – 2017-06-24 (×7): 1000 ug via ORAL
  Filled 2017-06-17 (×7): qty 1

## 2017-06-17 MED ORDER — PROCHLORPERAZINE MALEATE 5 MG PO TABS: 5.0000 mg | ORAL_TABLET | Freq: Four times a day (QID) | ORAL | Status: DC | PRN

## 2017-06-17 MED ORDER — ASPIRIN 325 MG PO TBEC
325.0000 mg | DELAYED_RELEASE_TABLET | Freq: Every day | ORAL | 0 refills | Status: DC
Start: 2017-06-18 — End: 2017-08-14

## 2017-06-17 MED ORDER — FERROUS SULFATE 325 (65 FE) MG PO TABS
325.0000 mg | ORAL_TABLET | Freq: Every day | ORAL | Status: DC
  Administered 2017-06-18 – 2017-06-24 (×5): 325 mg via ORAL
  Filled 2017-06-17 (×5): qty 1

## 2017-06-17 MED ORDER — BISACODYL 10 MG RE SUPP: 10.0000 mg | Freq: Every day | RECTAL | Status: DC | PRN

## 2017-06-17 MED ORDER — ZOLPIDEM TARTRATE 5 MG PO TABS: 5.0000 mg | ORAL_TABLET | Freq: Every day | ORAL | 0 refills | Status: DC

## 2017-06-17 MED ORDER — CLOPIDOGREL BISULFATE 75 MG PO TABS
75.0000 mg | ORAL_TABLET | Freq: Every day | ORAL | Status: DC
  Administered 2017-06-18 – 2017-06-24 (×7): 75 mg via ORAL
  Filled 2017-06-17 (×7): qty 1

## 2017-06-17 MED ORDER — ACETAMINOPHEN 325 MG PO TABS
325.0000 mg | ORAL_TABLET | ORAL | Status: DC | PRN
  Administered 2017-06-21: 650 mg via ORAL
  Filled 2017-06-17: qty 2

## 2017-06-17 MED ORDER — ENOXAPARIN SODIUM 40 MG/0.4ML ~~LOC~~ SOLN
40.0000 mg | SUBCUTANEOUS | Status: DC
  Administered 2017-06-18 – 2017-06-23 (×6): 40 mg via SUBCUTANEOUS
  Filled 2017-06-17 (×7): qty 0.4

## 2017-06-17 MED ORDER — DIPHENHYDRAMINE HCL 12.5 MG/5ML PO ELIX
12.5000 mg | ORAL_SOLUTION | Freq: Four times a day (QID) | ORAL | Status: DC | PRN
  Administered 2017-06-17: 25 mg via ORAL
  Filled 2017-06-17: qty 10

## 2017-06-17 MED ORDER — DIPHENHYDRAMINE HCL 25 MG PO CAPS: 25.0000 mg | ORAL_CAPSULE | Freq: Four times a day (QID) | ORAL | 0 refills | Status: DC | PRN

## 2017-06-17 MED ORDER — POLYETHYLENE GLYCOL 3350 17 G PO PACK: 17.0000 g | PACK | Freq: Every day | ORAL | Status: DC | PRN

## 2017-06-17 MED ORDER — HYDROCODONE-ACETAMINOPHEN 5-325 MG PO TABS
0.5000 | ORAL_TABLET | Freq: Four times a day (QID) | ORAL | Status: DC | PRN
  Administered 2017-06-17 – 2017-06-24 (×10): 1 via ORAL
  Filled 2017-06-17 (×10): qty 1

## 2017-06-17 MED ORDER — ATORVASTATIN CALCIUM 80 MG PO TABS: 80.0000 mg | ORAL_TABLET | Freq: Every day | ORAL | 0 refills | Status: DC

## 2017-06-17 MED ORDER — ATORVASTATIN CALCIUM 80 MG PO TABS
80.0000 mg | ORAL_TABLET | Freq: Every day | ORAL | Status: DC
  Administered 2017-06-17 – 2017-06-23 (×7): 80 mg via ORAL
  Filled 2017-06-17 (×7): qty 1

## 2017-06-17 MED ORDER — DOCUSATE SODIUM 100 MG PO CAPS
200.0000 mg | ORAL_CAPSULE | Freq: Every day | ORAL | Status: DC
  Administered 2017-06-18 – 2017-06-24 (×7): 200 mg via ORAL
  Filled 2017-06-17 (×7): qty 2

## 2017-06-17 MED ORDER — GUAIFENESIN-DM 100-10 MG/5ML PO SYRP: 5.0000 mL | ORAL_SOLUTION | Freq: Four times a day (QID) | ORAL | Status: DC | PRN

## 2017-06-17 MED ORDER — PROCHLORPERAZINE 25 MG RE SUPP: 12.5000 mg | Freq: Four times a day (QID) | RECTAL | Status: DC | PRN

## 2017-06-17 MED ORDER — DICLOFENAC SODIUM 1 % TD GEL
2.0000 g | Freq: Four times a day (QID) | TRANSDERMAL | Status: DC
  Administered 2017-06-17 – 2017-06-24 (×10): 2 g via TOPICAL
  Filled 2017-06-17: qty 100

## 2017-06-17 NOTE — H&P (Signed)
Physical Medicine and Rehabilitation Admission H&P       Chief Complaint  Patient presents with  . Stroke with deficits in mobility/ADLs  . Back Pain X months    HPI: Leslie Simmons a 51 y.o.femalewith history fo HTN, hemorrhagic stroke with residual right sided weakness/numbness and seizure who was admitted on 06/13/17 with reports of back pain and LLE weakness since the night before. MRI brain done revealing acute nonhemorrhagic infarct involving lateral right midbrain at right corticospinal tract and Remote L-MCA and basal ganglia infarcts.MRI lumbar spine done revealing L4 onL5 anterolisthesis with severe canal and bilateral subarticular stenosis and left subarticular Disc protrusion L5-S1 with impingement of S1 nerve. Work up ongoing and NS consulted for input on stenosis. Dr. Ronnald Ramp recommended therapy and elected decompression in the future. Therapy evaluations initiated revealing deficits in gait, ability to ADLs, lability and mild dysarthria with mild deficits in working memory. CIR recommended by rehab team   Review of Systems  HENT: Negative for hearing loss and tinnitus.   Eyes: Positive for blurred vision (wears glasses).  Respiratory: Negative for cough and shortness of breath.   Cardiovascular: Negative for chest pain and palpitations.  Gastrointestinal: Negative for constipation and heartburn.  Genitourinary: Negative for dysuria and urgency.  Musculoskeletal: Positive for back pain and myalgias.  Skin: Negative for itching and rash.  Neurological: Positive for sensory change (sensory deficits right hand/leg--old.  Left foot numbness), speech change, weakness and headaches. Negative for dizziness.  Psychiatric/Behavioral: Positive for memory loss. The patient has insomnia (chronic).           Past Medical History:  Diagnosis Date  . Hypertension   . Stroke [facility])     History reviewed. No pertinent surgical history.         Family History    Problem Relation Age of Onset  . Stroke Son     Social History:  Was living with family in [place] till a few weeks ago and hopes to go back there. Was visiting her boyfriend for a couple weeks prior to stroke.  Daughter (and her family)  has moved to [place] and plans on assisting after discharge? She does not use any alcohol, tobacco or illicit drugs.        Allergies  Allergen Reactions  . Gabapentin Swelling  . Codeine Rash          Medications Prior to Admission  Medication Sig Dispense Refill  . aspirin EC 81 MG tablet Take 81 mg by mouth daily.    Marland Kitchen atorvastatin (LIPITOR) 10 MG tablet Take 10 mg by mouth at bedtime.    . Cyanocobalamin (VITAMIN B-12) 1000 MCG SUBL Place 1,000 mcg under the tongue daily.    Marland Kitchen docusate sodium (COLACE) 100 MG capsule Take 100 mg by mouth daily.    . ferrous sulfate 325 (65 FE) MG tablet Take 325 mg by mouth daily.    . hydrochlorothiazide (HYDRODIURIL) 25 MG tablet Take 25 mg by mouth every morning.    Marland Kitchen ibuprofen (ADVIL,MOTRIN) 600 MG tablet Take 600 mg by mouth every 8 (eight) hours as needed for pain.    Marland Kitchen lamoTRIgine (LAMICTAL) 100 MG tablet Take 200 mg by mouth daily.    . metoprolol tartrate (LOPRESSOR) 100 MG tablet Take 100 mg by mouth 2 (two) times daily.    . valsartan (DIOVAN) 320 MG tablet Take 320 mg by mouth daily.    Marland Kitchen zolpidem (AMBIEN) 10 MG tablet Take 10 mg by mouth at  bedtime.      Home: Home Living Family/patient expects to be discharged to:: Private residence Living Arrangements: Spouse/significant other, Children Available Help at Discharge: Family, Available PRN/intermittently Type of Home: House Home Access: Level entry (per pt) Entrance Stairs-Number of Steps: 2 [place]: One level Bathroom Shower/Tub: [place] unit, Multimedia programmer: Standard Bathroom Accessibility: Yes Home Equipment: Environmental consultant - 4 wheels Additional Comments: pt lives in [place] With: Family  (pt has given different information to PT than OT has receive)   Functional History: Prior Function Level of Independence: Independent  Functional Status:  Mobility: Bed Mobility Overal bed mobility: Needs Assistance Bed Mobility: Supine to Sit, Sit to Supine Supine to sit: Min assist, Mod assist (min to lift trunk then mod to finish scooting to EOB) Sit to supine: Mod assist, Max assist Sit to sidelying: Mod assist General bed mobility comments: up in the chair Transfers Overall transfer level: Needs assistance Equipment used: Rolling walker (2 wheeled), 1 person hand held assist Transfers: Sit to/from Stand Sit to Stand: Min assist, Min guard Stand pivot transfers: Mod assist General transfer comment: worked on sit to stand to sit technique especially coming forward to stand and sit effortlessly and with control Ambulation/Gait Ambulation/Gait assistance: Min assist Ambulation Distance (Feet): 100 Feet Assistive device: Rolling walker (2 wheeled) Gait Pattern/deviations: Step-to pattern, Step-through pattern, Decreased step length - right, Decreased stance time - left, Decreased step length - left, Decreased stride length General Gait Details: Transition from step to to step through with concentration on heel toe pattern and some pelvic translation .  As muscles fatigued, trunk lost control, needing stability support. Gait velocity: reduced Gait velocity interpretation: at or above normal speed for age/gender  ADL: ADL Overall ADL's : Needs assistance/impaired Eating/Feeding: Sitting, Minimal assistance Eating/Feeding Details (indicate cue type and reason): Issued built up handles. Pt performed self feed using built up handle on spoon to scoop apple sauce. Pt demonstrating increased fucntional performance and stabilizes apple sauce contianer with L hand while self feeding with R.  Min A provided to open apple sauce container Grooming: Minimal assistance Grooming Details  (indicate cue type and reason): Discussed that built up handle may be used on grooming utensils.  Upper Body Bathing: Minimal assistance, Sitting Lower Body Bathing: Moderate assistance, Sit to/from stand Upper Body Dressing : Moderate assistance, Sitting Lower Body Dressing: Moderate assistance, Sit to/from stand Toilet Transfer: RW, Moderate assistance (LLE buckling at times) Toileting- Clothing Manipulation and Hygiene: Moderate assistance, Sit to/from stand Functional mobility during ADLs: Moderate assistance, Rolling walker, Cueing for safety General ADL Comments: Focused on FM skills, targeted grasp, and using built up handle for self feeding, grooming, and writing.  Cognition: Cognition Overall Cognitive Status: Within Functional Limits for tasks assessed Arousal/Alertness: Awake/alert Orientation Level: Oriented X4 Attention: Sustained Sustained Attention: Appears intact Memory: Impaired Memory Impairment: Retrieval deficit Awareness: Appears intact Safety/Judgment: Appears intact Cognition Arousal/Alertness: Awake/alert Behavior During Therapy: WFL for tasks assessed/performed Overall Cognitive Status: Within Functional Limits for tasks assessed General Comments: Pt follows instructions with extra time   Blood pressure 128/83, pulse 78, temperature 98.7 F (37.1 C), temperature source Oral, resp. rate 20, height 5' (1.524 m), weight 80.5 kg (177 lb 7.5 oz), SpO2 97 %. Physical Exam  Nursing note and vitals reviewed. Constitutional: She is oriented to person, place, and time. She appears well-developed and well-nourished. No distress.  HENT:  Head: Normocephalic and atraumatic.  Mouth/Throat: Oropharynx is clear and moist.  Eyes: Pupils are equal, round,  and reactive to light. Conjunctivae and EOM are normal.  Neck: Normal range of motion. Neck supple.  Cardiovascular: Normal rate, regular rhythm and intact distal pulses.   Respiratory: Effort normal and breath  sounds normal. No stridor. No respiratory distress. She has no wheezes.  GI: Soft. Bowel sounds are normal. She exhibits no distension. There is no tenderness.  Musculoskeletal: She exhibits no edema or tenderness.  Neurological: She is alert and oriented to person, place, and time.  Speech slow with mild ataxia. Able to follow basic commands but slow to process. Has poor insight into deficits.   LUE>RUE weakness with ataxia. Decrease in [facility] BLE.  DTR 2+   Skin: Skin is warm and dry. She is not diaphoretic.  Psychiatric: She has a normal mood and affect. She is slowed. She expresses inappropriate judgment.    Lab Results Last 48 Hours       Results for orders placed or performed during the hospital encounter of 06/13/17 (from the past 48 hour(s))  Glucose, capillary     Status: Abnormal   Collection Time: 06/16/17 12:25 PM  Result Value Ref Range   Glucose-Capillary 146 (H) 65 - 99 mg/dL     Imaging Results (Last 48 hours)  No results found.       Medical Problem List and Plan: 1.  Left sided weakness and functional deficits secondary to right midbrain infarct, lumbosacral spondylosis with radiculopathy             -admit to inpatient rehab 2.  DVT Prophylaxis/Anticoagulation: Pharmaceutical: Lovenox 3. Chronic back pain/Pain Management: Hydrocodone effective. Will also order local measures.  4. Mood: LCSW to follow for evaluate and support.  5. Neuropsych: This patient is not fully capable of making decisions on her own behalf. 6. Skin/Wound Care: routine pressure relief measures.  7. Fluids/Electrolytes/Nutrition: Monitor I/O. Check lytes in am.  8. HTN: Monitor BP bid. Continue 9. Dyslipidemia: On lipitor.  10. Seizure disorder: On lamictal.  11. H/o recurrent strokes: Hypercoagulopathy panel negative.  12. Chronic insomnia: uses ambien at home.  13. Constipation: resolved with miralax.    Post Admission Physician Evaluation: 1. Functional deficits secondary   to right midbrain infact. 2. Patient is admitted to receive collaborative, interdisciplinary care between the physiatrist, rehab nursing staff, and therapy team. 3. Patient's level of medical complexity and substantial therapy needs in context of that medical necessity cannot be provided at a lesser intensity of care such as a SNF. 4. Patient has experienced substantial functional loss from his/her baseline which was documented above under the "Functional History" and "Functional Status" headings.  Judging by the patient's diagnosis, physical exam, and functional history, the patient has potential for functional progress which will result in measurable gains while on inpatient rehab.  These gains will be of substantial and practical use upon discharge  in facilitating mobility and self-care at the household level. 5. Physiatrist will provide 24 hour management of medical needs as well as oversight of the therapy plan/treatment and provide guidance as appropriate regarding the interaction of the two. 6. The Preadmission Screening has been reviewed and patient status is unchanged unless otherwise stated above. 7. 24 hour rehab nursing will assist with bladder management, bowel management, safety, skin/wound care, disease management, medication administration, pain management and patient education  and help integrate therapy concepts, techniques,education, etc. 8. PT will assess and treat for/with: Lower extremity strength, range of motion, stamina, balance, functional mobility, safety, adaptive techniques and equipment, NMR.   Goals are: mod  I. 9. OT will assess and treat for/with: ADL's, functional mobility, safety, upper extremity strength, adaptive techniques and equipment, NMR, family ed.   Goals are: mod I. Therapy may proceed with showering this patient. 10. SLP will assess and treat for/with: speech, cognition.  Goals are: mod I. 11. Case Management and Social Worker will assess and treat for  psychological issues and discharge planning. 12. Team conference will be held weekly to assess progress toward goals and to determine barriers to discharge. 13. Patient will receive at least 3 hours of therapy per day at least 5 days per week. 14. ELOS: 13-19 days       15. Prognosis:  excellent     Meredith Staggers, MD, [facility] 06/17/2017  Bary Leriche, Hershal Coria 06/17/2017

## 2017-06-17 NOTE — Discharge Summary (Signed)
Physician Discharge Summary  Leslie Simmons UXN:[id number] DOB: 09-03-1966 DOA: 06/13/2017  PCP: System, Pcp Not In  Admit date: 06/13/2017 Discharge date: 06/17/2017  Admitted From: Home Disposition:  CIR  Recommendations for Outpatient Follow-up:  1. Follow up with PCP in 1-2 weeks 2. Follow up with Neurology in 6 weeks  3. Follow up with Neurosurgery within 1-2 weeks after Discharge 4. Allow for Permissive HTN and Have CIR restart Anti-hypertensive medications for  5. Please obtain CMP/CBC, Mag, Phos within one week  Home Health: No Equipment/Devices: None Recommended by PT    Discharge Condition: Stable CODE STATUS: FULL CODE Diet recommendation: Heart Healthy Diet   Brief/Interim Summary: Leslie Simmons a 51 y.o.femalewith medical history significant for hypertension, 2 hemorrhagic strokes with residual right-sided motor deficits, and history of seizures and other comorbids now presenting to the emergency department for evaluation of left leg weakness and low back pain. Patient reports that she had been in her usual state of health until last night when she noted the development of weakness involving the left leg. She reports that there is also some discomfort in the leg, but mainly in her low back. She has had the low back pain previously, but seems to be worsening. She has persistent right-sided weakness from her old strokes, but the left leg weakness is new as of last night. She denies any fall or trauma, denies saddle anesthesia or incontinence, and denies chest pain or palpitations. There is no headache, change in vision or hearing, or confusion.  MRI brain was performed and reveals a 1 cm acute ischemic nonhemorrhagic infarct involving the lateral right mid brain at the level of the cortical spinal tract. There is no mass effect on MRI. MRI lumbar spine was also performed and notable for severe canal stenosis at L4-5 and moderate stenosis at L3-4. Patient was treated with 377milligram  aspirin in the emergency department and neurosurgery was consulted by the ED physician. Neurosurgery evaluated patient and recommends Neurology consultation, suspecting that the leg weakness is related to the brain lesion. Neurology has been consulted and requests a medical admission. Patient was admitted for new left leg weakness with acute ischemic stroke on MRI brain and severe lumbar canal stenosis on MRI L-spine. Stroke worked up and now patient being transferred to CIR at the recommendation of PT/OT.   Discharge Diagnoses:  Principal Problem:   Acute ischemic stroke [facility]) Active Problems:   Lumbar spinal stenosis   Hypertensive urgency   Essential hypertension   History of hemorrhagic stroke with residual hemiparesis (HCC)   History of seizure   Left leg weakness   History of stroke   Hyperlipidemia   Constipation  1. Acute Ischemic CVA of the Lateral Midbrain  - Pt presents with left leg weakness that began the night prior  - Head CT showed No evidence of acute intracranial abnormality. Chronic left parietal and basal ganglia infarcts. - MRI brain showed 1 cm acute ischemic nonhemorrhagic infarct involving the lateral right midbrain at the level the right corticospinal tract. No associated mass effect. Remote posterior left MCA territory and left basal ganglia infarcts as above. - MRA Head w/o Contrast showed Occluded RIGHT posterior cerebral artery at P2 origin, mild collateralization. Occluded LEFT middle cerebral artery at M2 origin with poor Collateralization. Severe stenosis proximal RIGHT M2 segment and proximal LEFT P3 Segment. Focal irregularity and probable stenosis RIGHT supraclinoid internal carotid artery. 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 - CTA Head showed Severe stenosis RIGHT internal carotid artery  at anterior genu/supraclinoid segment due to advanced atherosclerosis. 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 Occluded RIGHT PCA at proximal P2  segment, poor collateralization and, likely acute given recent MRI findings. Severe stenosis RIGHT M2 and LEFT P3 segments. Moderate stenosis RIGHT M1 segment. 2 mm RIGHT PCOM origin aneurysm. - CTA Neck showed Atherosclerosis without hemodynamically significant stenosis or acute vascular process in the neck. Suspected cardiomegaly and pulmonary edema. Degenerative cervical spine resulting in severe canal stenosis C5-6. Moderate to severe C5-6 and C6-7 neural foraminal narrowing - ECHOCardiogram and showed The cavity size was normal. Wall thickness was increased in a pattern of mild LVH. Systolic function was vigorous. The estimated ejection fraction was in the range of 65% to 70%. - Neurology is consulted and appreciated Recc's  - Plan to continue cardiac monitoring - Lipid Panel showed Cholesterol of 141, HDL of 43, LDL of 83, TG of 74, VLDL 15 - Hemoglobin A1c was 5.9 - Hold antihypertensives and treat with prn agents if SBP >210  - C/w ASA 325 mg po Daily, Clopidogrel 75 mg po daily and Atorvastatin 80 mg po Daily - Obtain PT/OT/SLP - Neurology ordering Hypercoaguable Workup  - PT Eval recommending CIR; OT recommending CIR - CIR consulted and patient a candidate and now awaiting Bed Placement   2. Low Back Pain and Lumbar Spinal Stenosis - MRI L-spine with severe canal stenosis at L4-5, moderate stenosis at L3-4, and herniated disc at L5-S1  - Neurosurgery is consulting and much appreciated, suspects LLE weakness secondary to the acute stroke  - No change in bowel or bladder function, and no saddle anesthesia - Continue supportive care with prn analgesia  - Neurosurgery recommending Therapy for Strengthening and Ambulation - Will need Elective Decompression and Instrument Fusion at L4-5 with possible decompression at L3-4 and L5-S1 once over acute CVA - Follow up with Neurosurgery as an outpatient   3. Hypertension with hypertensive urgency  - BP elevated to 175/105 in ED - Likely  secondary to acute ischemic CVA  - Managed at home with Lopressor, Valsartan, and HCTZ  - Plan to hold her home antihypertensives in acute-phase of stroke, treat as needed for SBP >210 ; Metoprolol 100 mg po BID started back today - C/w Labetalol 5 mg IV q2hprn for SBP >210 - Allow for permissive HTN and BP now 128/83 - Defer to CIR Physician to restart Valsartan and HCTZ  4. Seizure Hx  - Continue Lamotrigine 200 mg po Daily  5. ?Cardiomegaly and Pulmonary Edema -Seen on CTA of Neck -CXR this AM showed The heart size and mediastinal contours are within normal limits. Both lungs are clear. The visualized skeletal structures are unremarkable. -Patient undergoing ECHOCardiogram and showed - Left ventricle: The cavity size was normal. Wall thickness was increased in a pattern of mild LVH. Systolic function wasvigorous. The estimated ejection fraction was in the range of 65% to 70%. -Repeat CXR in AM   6. Leukocytosis -Was likely reactive and now resolved. WBC went from 12.2 -> 12.1 -> 9.9 -Did not Repeat CBC today but can repeat in CIR  7. Constipation, improved -Patient had not had a bowel movement in 4 days but improved -C/w Docusate 100 mg po Daily and Senna-Docusate 1 tab po qHSprn -Added Miralax 17 grams po BID -Given Bisacodyl Suppository 10 mg RC once   8. Insomnia -C/w Ambien   Discharge Instructions  Discharge Instructions    Ambulatory referral to Neurology    Complete by:  As directed  Pt will follow up with stroke MD Erlinda Hong preferred, if not available, then consider Leonie Man, Penumalli or Ahern) at [facility] in about 2 months. Thanks.   Call MD for:  difficulty breathing, headache or visual disturbances    Complete by:  As directed    Call MD for:  hives    Complete by:  As directed    Call MD for:  persistant dizziness or light-headedness    Complete by:  As directed    Call MD for:  persistant nausea and vomiting    Complete by:  As directed    Call MD for:  redness,  tenderness, or signs of infection (pain, swelling, redness, odor or green/yellow discharge around incision site)    Complete by:  As directed    Call MD for:  severe uncontrolled pain    Complete by:  As directed    Call MD for:  temperature >100.4    Complete by:  As directed    Diet - low sodium heart healthy    Complete by:  As directed    Discharge instructions    Complete by:  As directed    Follow up Care at CIR; Antihypertensive Medications Held for Permissive HTN and will allow normalization in 5-7 days   Increase activity slowly    Complete by:  As directed      Allergies as of 06/17/2017      Reactions   Gabapentin Swelling   Codeine Rash      Medication List    STOP taking these medications   hydrochlorothiazide 25 MG tablet Commonly known as:  HYDRODIURIL   ibuprofen 600 MG tablet Commonly known as:  ADVIL,MOTRIN   valsartan 320 MG tablet Commonly known as:  DIOVAN     TAKE these medications   aspirin 325 MG EC tablet Take 1 tablet (325 mg total) by mouth daily. What changed:  medication strength  how much to take   atorvastatin 80 MG tablet Commonly known as:  LIPITOR Take 1 tablet (80 mg total) by mouth daily at 6 PM. What changed:  medication strength  how much to take  when to take this   clopidogrel 75 MG tablet Commonly known as:  PLAVIX Take 1 tablet (75 mg total) by mouth daily.   diphenhydrAMINE 25 mg capsule Commonly known as:  BENADRYL Take 1 capsule (25 mg total) by mouth every 6 (six) hours as needed for itching.   docusate sodium 100 MG capsule Commonly known as:  COLACE Take 100 mg by mouth daily.   ferrous sulfate 325 (65 FE) MG tablet Take 325 mg by mouth daily.   lamoTRIgine 100 MG tablet Commonly known as:  LAMICTAL Take 200 mg by mouth daily.   metoprolol tartrate 100 MG tablet Commonly known as:  LOPRESSOR Take 100 mg by mouth 2 (two) times daily.   polyethylene glycol packet Commonly known as:  MIRALAX /  GLYCOLAX Take 17 g by mouth 2 (two) times daily.   senna-docusate 8.6-50 MG tablet Commonly known as:  Senokot-S Take 1 tablet by mouth at bedtime as needed for mild constipation.   Vitamin B-12 1000 MCG Subl Place 1,000 mcg under the tongue daily.   zolpidem 5 MG tablet Commonly known as:  AMBIEN Take 1 tablet (5 mg total) by mouth at bedtime. What changed:  medication strength  how much to take            Discharge Care Instructions  Start     Ordered   06/18/17 0000  aspirin EC 325 MG EC tablet  Daily     06/17/17 1154   06/18/17 0000  clopidogrel (PLAVIX) 75 MG tablet  Daily     06/17/17 1154   06/17/17 0000  atorvastatin (LIPITOR) 80 MG tablet  Daily-1800     06/17/17 1154   06/17/17 0000  zolpidem (AMBIEN) 5 MG tablet  Daily at bedtime     06/17/17 1154   06/17/17 0000  diphenhydrAMINE (BENADRYL) 25 mg capsule  Every 6 hours PRN     06/17/17 1154   06/17/17 0000  polyethylene glycol (MIRALAX / GLYCOLAX) packet  2 times daily     06/17/17 1154   06/17/17 0000  senna-docusate (SENOKOT-S) 8.6-50 MG tablet  At bedtime PRN     06/17/17 1154   06/17/17 0000  Increase activity slowly     06/17/17 1154   06/17/17 0000  Diet - low sodium heart healthy     06/17/17 1154   06/17/17 0000  Discharge instructions    Comments:  Follow up Care at [facility]; Antihypertensive Medications Held for Permissive HTN and will allow normalization in 5-7 days   06/17/17 1154   06/17/17 0000  Call MD for:  temperature >100.4     06/17/17 1154   06/17/17 0000  Call MD for:  persistant nausea and vomiting     06/17/17 1154   06/17/17 0000  Call MD for:  severe uncontrolled pain     06/17/17 1154   06/17/17 0000  Call MD for:  redness, tenderness, or signs of infection (pain, swelling, redness, odor or green/yellow discharge around incision site)     06/17/17 1154   06/17/17 0000  Call MD for:  difficulty breathing, headache or visual disturbances     06/17/17 1154   06/17/17 0000   Call MD for:  hives     06/17/17 1154   06/17/17 0000  Call MD for:  persistant dizziness or light-headedness     06/17/17 1154   06/14/17 0000  Ambulatory referral to Neurology    Comments:  Pt will follow up with stroke MD Erlinda Hong preferred, if not available, then consider Leonie Man, Penumalli or Ahern) at [facility] in about 2 months. Thanks.   06/14/17 2229     Follow-up Information    Rosalin Hawking, MD. Schedule an appointment as soon as possible for a visit in 6 week(s).   Specialty:  Neurology Contact information: [address] [place] [address] [phone]          Allergies  Allergen Reactions  . Gabapentin Swelling  . Codeine Rash   Consultations:  Neurology  Neurosurgery  Procedures/Studies: Ct Angio Head W Or Wo Contrast  Result Date: 06/14/2017 CLINICAL DATA:  Follow-up stroke and cerebral artery occlusion. EXAM: CT ANGIOGRAPHY HEAD AND NECK TECHNIQUE: Multidetector CT imaging of the head and neck was performed using the standard protocol during bolus administration of intravenous contrast. Multiplanar CT image reconstructions and MIPs were obtained to evaluate the vascular anatomy. Carotid stenosis measurements (when applicable) are obtained utilizing NASCET criteria, using the distal internal carotid diameter as the denominator. CONTRAST:  50 cc Isovue 370 COMPARISON:  MRI and MRA head June 13, 2017 FINDINGS: CTA NECK AORTIC ARCH: Normal appearance of the thoracic arch, 2 vessel arch is a normal variant. Mild calcific atherosclerosis aortic arch. Intimal thickening resulting in moderate stenosis RIGHT subclavian artery origin. RIGHT CAROTID SYSTEM: Common carotid artery is widely patent,  retropharyngeal course. Mild eccentric calcific atherosclerosis RIGHT carotid bifurcation without hemodynamically significant stenosis by NASCET criteria. RIGHT tonsillar loop. LEFT CAROTID SYSTEM: Common carotid artery is widely patent, coursing in a straight line fashion.  Retropharyngeal course. Mild eccentric intimal thickening without hemodynamically significant stenosis by NASCET criteria. VERTEBRAL ARTERIES:Left vertebral artery is dominant. Patent bilateral vertebral artery's, mild extrinsic deformity due to degenerative cervical spine. SKELETON: No acute osseous process though bone windows have not been submitted. OTHER NECK: Soft tissues of the neck are nonacute though, not tailored for evaluation. Reversed cervical lordosis with severe C5-6 and C6-7 degenerative discs, moderate at C4-5. Severe canal stenosis C5-6. Moderate to severe C5-6 and C6-7 neural foraminal narrowing. UPPER CHEST: Included lung apices are clear. No superior mediastinal lymphadenopathy. Mild suspected cardiomegaly with hazy ground-glass opacities and pulmonary vascular congestion. CTA HEAD ANTERIOR CIRCULATION: Calcific atherosclerosis resulting in severe stenosis RIGHT anterior genu of the internal carotid artery to paraophthalmic segment. 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 Moderate stenosis LEFT supraclinoid internal carotid artery. Thready RIGHT A1 segment with moderate luminal irregularity LEFT A1 segment. Patent anterior communicating artery. Bilateral anterior cerebral artery's are patent. Occluded LEFT M2 origin, superior division. Thready inferior division with poor collateralization in pruning. Moderate stenosis RIGHT M1 segment. Severe stenosis proximal RIGHT M2 segment. No large vessel occlusion, significant stenosis, contrast extravasation or aneurysm. POSTERIOR CIRCULATION: Patent vertebral arteries, vertebrobasilar junction and basilar artery, as well as main branch vessels. Occluded RIGHT proximal P2 segment with mild collateralization. Severe stenosis LEFT P3 origin. No large vessel occlusion, significant stenosis, contrast extravasation or aneurysm. VENOUS SINUSES: Major dural venous sinuses are patent though not tailored for evaluation on this  angiographic examination. ANATOMIC VARIANTS: None. DELAYED PHASE: No abnormal intracranial enhancement. MIP images reviewed. IMPRESSION: CTA NECK: 1. Atherosclerosis without hemodynamically significant stenosis or acute vascular process in the neck. 2. Suspected cardiomegaly and pulmonary edema. Recommend chest radiograph. 3. Degenerative cervical spine resulting in severe canal stenosis C5-6. Moderate to severe C5-6 and C6-7 neural foraminal narrowing. CTA HEAD: 1. Severe stenosis RIGHT internal carotid artery at anterior genu/supraclinoid segment due to advanced atherosclerosis. 2. 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 3. Occluded RIGHT PCA at proximal P2 segment, poor collateralization and, likely acute given recent MRI findings. 4. Severe stenosis RIGHT M2 and LEFT P3 segments. Moderate stenosis RIGHT M1 segment. 5. 2 mm RIGHT PCOM origin aneurysm. Aortic Atherosclerosis (ICD10-I70.0). Electronically Signed   By: Elon Alas M.D.   On: 06/14/2017 02:28   Ct Head Wo Contrast  Result Date: 06/13/2017 CLINICAL DATA:  Bilateral lower extremity weakness for 2 days. History of stroke with residual right-sided weakness. EXAM: CT HEAD WITHOUT CONTRAST TECHNIQUE: Contiguous axial images were obtained from the base of the skull through the vertex without intravenous contrast. COMPARISON:  None. FINDINGS: Brain: There is a moderate-sized chronic left MCA infarct in the parietal lobe. A chronic lateral lenticulostriate infarct is noted involving the anterior limb of the left internal capsule and basal ganglia. There is slight ex vacuo enlargement of the left lateral ventricle. There is no evidence of acute infarct, intracranial hemorrhage, mass, midline shift, or extra-axial fluid collection. Vascular: Calcified atherosclerosis at the skullbase. No hyperdense vessel. Skull: No fracture or focal osseous lesion. Sinuses/Orbits: Unremarkable orbits. Partially visualized small osteoma in the left maxillary  sinus. Clear mastoid air cells. Other: None. IMPRESSION: 1. No evidence of acute intracranial abnormality. 2. Chronic left parietal and basal ganglia infarcts. Electronically Signed   By: Logan Bores  M.D.   On: 06/13/2017 10:47   Ct Angio Neck W Or Wo Contrast  Result Date: 06/14/2017 CLINICAL DATA:  Follow-up stroke and cerebral artery occlusion. EXAM: CT ANGIOGRAPHY HEAD AND NECK TECHNIQUE: Multidetector CT imaging of the head and neck was performed using the standard protocol during bolus administration of intravenous contrast. Multiplanar CT image reconstructions and MIPs were obtained to evaluate the vascular anatomy. Carotid stenosis measurements (when applicable) are obtained utilizing NASCET criteria, using the distal internal carotid diameter as the denominator. CONTRAST:  50 cc Isovue 370 COMPARISON:  MRI and MRA head June 13, 2017 FINDINGS: CTA NECK AORTIC ARCH: Normal appearance of the thoracic arch, 2 vessel arch is a normal variant. Mild calcific atherosclerosis aortic arch. Intimal thickening resulting in moderate stenosis RIGHT subclavian artery origin. RIGHT CAROTID SYSTEM: Common carotid artery is widely patent, retropharyngeal course. Mild eccentric calcific atherosclerosis RIGHT carotid bifurcation without hemodynamically significant stenosis by NASCET criteria. RIGHT tonsillar loop. LEFT CAROTID SYSTEM: Common carotid artery is widely patent, coursing in a straight line fashion. Retropharyngeal course. Mild eccentric intimal thickening without hemodynamically significant stenosis by NASCET criteria. VERTEBRAL ARTERIES:Left vertebral artery is dominant. Patent bilateral vertebral artery's, mild extrinsic deformity due to degenerative cervical spine. SKELETON: No acute osseous process though bone windows have not been submitted. OTHER NECK: Soft tissues of the neck are nonacute though, not tailored for evaluation. Reversed cervical lordosis with severe C5-6 and C6-7 degenerative discs,  moderate at C4-5. Severe canal stenosis C5-6. Moderate to severe C5-6 and C6-7 neural foraminal narrowing. UPPER CHEST: Included lung apices are clear. No superior mediastinal lymphadenopathy. Mild suspected cardiomegaly with hazy ground-glass opacities and pulmonary vascular congestion. CTA HEAD ANTERIOR CIRCULATION: Calcific atherosclerosis resulting in severe stenosis RIGHT anterior genu of the internal carotid artery to paraophthalmic segment. 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 Moderate stenosis LEFT supraclinoid internal carotid artery. Thready RIGHT A1 segment with moderate luminal irregularity LEFT A1 segment. Patent anterior communicating artery. Bilateral anterior cerebral artery's are patent. Occluded LEFT M2 origin, superior division. Thready inferior division with poor collateralization in pruning. Moderate stenosis RIGHT M1 segment. Severe stenosis proximal RIGHT M2 segment. No large vessel occlusion, significant stenosis, contrast extravasation or aneurysm. POSTERIOR CIRCULATION: Patent vertebral arteries, vertebrobasilar junction and basilar artery, as well as main branch vessels. Occluded RIGHT proximal P2 segment with mild collateralization. Severe stenosis LEFT P3 origin. No large vessel occlusion, significant stenosis, contrast extravasation or aneurysm. VENOUS SINUSES: Major dural venous sinuses are patent though not tailored for evaluation on this angiographic examination. ANATOMIC VARIANTS: None. DELAYED PHASE: No abnormal intracranial enhancement. MIP images reviewed. IMPRESSION: CTA NECK: 1. Atherosclerosis without hemodynamically significant stenosis or acute vascular process in the neck. 2. Suspected cardiomegaly and pulmonary edema. Recommend chest radiograph. 3. Degenerative cervical spine resulting in severe canal stenosis C5-6. Moderate to severe C5-6 and C6-7 neural foraminal narrowing. CTA HEAD: 1. Severe stenosis RIGHT internal carotid artery  at anterior genu/supraclinoid segment due to advanced atherosclerosis. 2. 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 3. Occluded RIGHT PCA at proximal P2 segment, poor collateralization and, likely acute given recent MRI findings. 4. Severe stenosis RIGHT M2 and LEFT P3 segments. Moderate stenosis RIGHT M1 segment. 5. 2 mm RIGHT PCOM origin aneurysm. Aortic Atherosclerosis (ICD10-I70.0). Electronically Signed   By: Elon Alas M.D.   On: 06/14/2017 02:28   Mr Brain Wo Contrast (neuro Protocol)  Result Date: 06/13/2017 CLINICAL DATA:  Initial evaluation for acute left leg weakness. EXAM: MRI HEAD WITHOUT CONTRAST TECHNIQUE:  Multiplanar, multiecho pulse sequences of the brain and surrounding structures were obtained without intravenous contrast. COMPARISON:  Prior CT from earlier the same day. FINDINGS: Brain: Generalized age appropriate cerebral atrophy. Encephalomalacia with gliosis within the posterior left frontoparietal region compatible with remote left MCA territory infarct. Additional remote lacunar type infarcts present within the left basal ganglia. Approximate 1 cm focus of restricted diffusion at the lateral right midbrain at the level the right cortical spinal tract (series 3, image 24), consistent with an acute ischemic infarct. No associated mass effect or hemorrhage. No other evidence for acute or subacute ischemia. No other areas of chronic infarction. No other acute or chronic intracranial hemorrhage. No mass lesion, midline shift or mass effect. No hydrocephalus. No extra-axial fluid collection. Major dural sinuses are grossly patent. Pituitary gland suprasellar region within normal limits. Vascular: Major intracranial vascular flow voids maintained. Skull and upper cervical spine: Craniocervical junction normal. Mild degenerate spondylolysis noted within the upper cervical spine without significant stenosis. Bone marrow signal intensity within normal limits. No scalp soft tissue  abnormality. Sinuses/Orbits: Globes and orbital soft tissues within normal limits. Mild scattered mucosal thickening within the ethmoidal air cells and maxillary sinuses. Paranasal sinuses are otherwise clear. No mastoid effusion. Inner ear structures normal. IMPRESSION: 1. 1 cm acute ischemic nonhemorrhagic infarct involving the lateral right midbrain at the level the right corticospinal tract. No associated mass effect. 2. Remote posterior left MCA territory and left basal ganglia infarcts as above. Electronically Signed   By: Jeannine Boga M.D.   On: 06/13/2017 18:15   Mr Lumbar Spine Wo Contrast  Result Date: 06/13/2017 CLINICAL DATA:  Initial evaluation for acute back pain, leg weakness. EXAM: MRI LUMBAR SPINE WITHOUT CONTRAST TECHNIQUE: Multiplanar, multisequence MR imaging of the lumbar spine was performed. No intravenous contrast was administered. COMPARISON:  None. FINDINGS: Segmentation: Normal segmentation. Lowest well-formed disc labeled the L5-S1 level. Alignment: 4 mm anterolisthesis of L4 on L5. Trace retrolisthesis of L2 on L3 and L3 on L4. Vertebral bodies otherwise normally aligned with preservation of the normal lumbar lordosis. Vertebrae: Vertebral body heights maintained. No evidence for acute or chronic fracture. Signal intensity within the vertebral body bone marrow within normal limits. No discrete or worrisome osseous lesions. No abnormal marrow edema. Conus medullaris: Extends to the L1 level and appears normal. Paraspinal and other soft tissues: Paraspinous soft tissues demonstrate no acute abnormality. Fibroid uterus noted. Partially visualized visceral structures otherwise unremarkable. Disc levels: Mild diffuse congenital shortening of the pedicles noted. L1-2:  Unremarkable. L2-3: Diffuse degenerative disc bulge with disc desiccation. Superimposed left foraminal/ extraforaminal disc protrusion, contacting the exiting left L2 nerve root as it courses of the left neural  foramen (series 4, image 11). No significant canal stenosis. Mild bilateral L2 foraminal stenosis related to disc bulge and short pedicles. L3-4: Diffuse disc bulge with disc desiccation and mild intervertebral disc space narrowing. Mild facet and ligamentum flavum hypertrophy. Short pedicles. Resultant moderate canal and bilateral subarticular stenosis. Mild to moderate bilateral L3 foraminal narrowing. L4-5: 4 mm anterolisthesis of L4 on L5. Associated diffuse disc bulge. Moderate bilateral facet arthrosis with ligamentum flavum hypertrophy, slightly worse on the right. Short pedicles. Severe canal and bilateral subarticular stenosis. Moderate bilateral L4 foraminal narrowing, right worse than left. L5-S1: Diffuse disc bulge with disc desiccation and intervertebral disc space narrowing. Superimposed left subarticular disc protrusion encroaches upon the left lateral recess, impinging upon the descending left S1 nerve root (series 5, image 25). Mild bilateral facet hypertrophy. Relatively mild canal  narrowing. Mild left L5 foraminal stenosis present as well. IMPRESSION: 1. 4 mm anterolisthesis of L4 on L5 with associated disc bulge and moderate facet arthropathy, resulting in severe canal and bilateral subarticular stenosis, with moderate bilateral L4 foraminal narrowing. 2. Left subarticular disc protrusion at L5-S1, impinging upon the descending left S1 nerve root in the left lateral recess. 3. Left foraminal/extraforaminal disc protrusion at L2-3, contacting and potentially irritating the exiting left L2 nerve root. 4. Diffuse congenital shortening of pedicles. 5. Fibroid uterus. Electronically Signed   By: Jeannine Boga M.D.   On: 06/13/2017 18:41   Dg Chest Port 1 View  Result Date: 06/14/2017 CLINICAL DATA:  Acute ischemic stroke.  Hypertension. EXAM: PORTABLE CHEST 1 VIEW COMPARISON:  None. FINDINGS: The heart size and mediastinal contours are within normal limits. Both lungs are clear. The  visualized skeletal structures are unremarkable. IMPRESSION: No active disease. Electronically Signed   By: Earle Gell M.D.   On: 06/14/2017 07:43   Mr Jodene Nam Head Wo Contrast  Result Date: 06/13/2017 CLINICAL DATA:  Follow-up acute midbrain infarct. Recent unsteady gait. History of strokes. EXAM: MRA HEAD WITHOUT CONTRAST TECHNIQUE: Angiographic images of the Circle of Willis were obtained using MRA technique without intravenous contrast. COMPARISON:  MRI of the head June 13, 2017 at 1719 hours. FINDINGS: ANTERIOR CIRCULATION: Normal flow related enhancement of the included cervical, petrous, cavernous and supraclinoid internal carotid arteries. 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 Patent fenestrated anterior communicating artery. Patent anterior cerebral arteries, including distal segments. Occluded LEFT proximal M2 segment with paucity of mid to distal LEFT middle cerebral artery's. Severe stenosis proximal RIGHT M2 segment. Moderate luminal regularity proximal RIGHT middle cerebral artery's. POSTERIOR CIRCULATION: LEFT vertebral artery is dominant. Basilar artery is patent, with normal flow related enhancement of the main branch vessels. Occluded RIGHT proximal P2 segment with thready collaterals. Small bilateral posterior communicating artery is present. Severe stenosis proximal LEFT P3 segment. No large vessel occlusion, high-grade stenosis,  aneurysm. ANATOMIC VARIANTS: None. Source images and MIP images were reviewed. IMPRESSION: 1. 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 2. Occluded LEFT middle cerebral artery at M2 origin with poor collateralization. 3. Severe stenosis proximal RIGHT M2 segment and proximal LEFT P3 segment. 4. Focal irregularity and probable stenosis RIGHT supraclinoid internal carotid artery. 5. 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 6. Recommend CTA for further characterization. Acute findings discussed with and reconfirmed by Dr.TIMOTHY OPYD on 06/13/2017 at 11:50 pm. Electronically Signed   By: Elon Alas M.D.   On: 06/13/2017 23:50    ECHOCARDIOGRAM - Left ventricle: The cavity size was normal. Wall thickness was increased in a pattern of mild LVH. Systolic function wasvigorous. The estimated ejection fraction was in the range of 65%to 70%.  Subjective: Seen and examined at bedside and was doing better. No nausea or vomiting. Ready to go get Rehab.  Discharge Exam: Vitals:   06/17/17 0605 06/17/17 0939  BP: 132/73 128/83  Pulse: 68 78  Resp: 20 20  Temp: 98.2 F (36.8 C) 98.7 F (37.1 C)  SpO2: 97% 97%   Vitals:   06/16/17 2019 06/17/17 0125 06/17/17 0605 06/17/17 0939  BP: (!) 151/93 116/90 132/73 128/83  Pulse: 100 (!) 110 68 78  Resp: 18 16 20 20   Temp: 98.6 F (37 C) 98.1 F (36.7 C) 98.2 F (36.8 C) 98.7 F (37.1 C)  TempSrc: Oral Oral  Oral Oral  SpO2: 98% 95% 97% 97%  Weight:      Height:       General: Pt is alert, awake, not in acute distress Cardiovascular: RRR, S1/S2 +, no rubs, no gallops Respiratory: CTA bilaterally, no wheezing, no rhonchi Abdominal: Soft, NT, ND, bowel sounds + Extremities: no edema, no cyanosis; Had Lower Extremity Weakness  The results of significant diagnostics from this hospitalization (including imaging, microbiology, ancillary and laboratory) are listed below for reference.    Microbiology: No results found for this or any previous visit (from the past 240 hour(s)).   Labs: BNP (last 3 results) No results for input(s): BNP in the last 8760 hours. Basic Metabolic Panel:  Recent Labs Lab 06/13/17 1144 06/14/17 0913 06/15/17 0315  NA 139 141 140  K 4.0 3.6 3.7  CL 106 110 109  CO2 23 23 23   GLUCOSE 101* 101* 99  BUN 9 9 12   CREATININE 0.62 0.56 0.66  CALCIUM 10.2 9.2 9.3  MG  --  1.7 1.8  PHOS  --  3.9 4.2    Liver Function Tests:  Recent Labs Lab 06/13/17 1144 06/14/17 0913 06/15/17 0315  AST 31 26 27   ALT 38 32 29  ALKPHOS 61 52 49  BILITOT 0.4 0.5 0.4  PROT 7.6 6.8 6.4*  ALBUMIN 3.9 3.4* 3.5   No results for input(s): LIPASE, AMYLASE in the last 168 hours. No results for input(s): AMMONIA in the last 168 hours. CBC:  Recent Labs Lab 06/13/17 1144 06/14/17 0913 06/15/17 0315  WBC 12.2* 12.1* 9.9  NEUTROABS 8.2* 8.0* 5.3  HGB 13.0 12.1 12.3  HCT 40.7 37.8 38.6  MCV 90.0 89.8 89.4  PLT 303 280 257   Cardiac Enzymes: No results for input(s): CKTOTAL, CKMB, CKMBINDEX, TROPONINI in the last 168 hours. BNP: Invalid input(s): POCBNP CBG:  Recent Labs Lab 06/16/17 1225  GLUCAP 146*   D-Dimer No results for input(s): DDIMER in the last 72 hours. Hgb A1c No results for input(s): HGBA1C in the last 72 hours. Lipid Profile No results for input(s): CHOL, HDL, LDLCALC, TRIG, CHOLHDL, LDLDIRECT in the last 72 hours. Thyroid function studies No results for input(s): TSH, T4TOTAL, T3FREE, THYROIDAB in the last 72 hours.  Invalid input(s): FREET3 Anemia work up No results for input(s): VITAMINB12, FOLATE, FERRITIN, TIBC, IRON, RETICCTPCT in the last 72 hours. Urinalysis    Component Value Date/Time   COLORURINE STRAW (A) 06/13/2017 1116   APPEARANCEUR CLEAR 06/13/2017 1116   LABSPEC 1.008 06/13/2017 1116   PHURINE 5.0 06/13/2017 1116   GLUCOSEU NEGATIVE 06/13/2017 1116   HGBUR MODERATE (A) 06/13/2017 1116   BILIRUBINUR NEGATIVE 06/13/2017 [place] 06/13/2017 1116   PROTEINUR NEGATIVE 06/13/2017 1116   NITRITE NEGATIVE 06/13/2017 1116   LEUKOCYTESUR NEGATIVE 06/13/2017 1116   Sepsis Labs Invalid input(s): PROCALCITONIN,  WBC,  LACTICIDVEN Microbiology No results found for this or any previous visit (from the past 240 hour(s)).  Time coordinating discharge: 35 minutes  SIGNED:  Kerney Elbe, DO [facility] 06/17/2017, 11:55  AM Pager [phone]  If 7PM-7AM, please contact night-coverage [email or web address] Password [email or web address]

## 2017-06-17 NOTE — Care Management Important Message (Signed)
Important Message  Patient Details  Name: Leslie Simmons MRN: [id number] Date of Birth: [birth year]   Medicare Important Message Given:  Yes    Albana Saperstein 06/17/2017, 11:18 AM

## 2017-06-17 NOTE — Progress Notes (Signed)
Pt received from 5C18 by wheelchair escorted by Sudie Grumbling, RN. Report received from [facility] by telephone at 12:45pm prior to pt arrival. Pt is A&O x4. Pt oriented to unit, unit activities, and therapy schedule. Pt complained of "ribbed" chux being "itchy". Replaced with a quilted chux pt reports its more comfortable. Assessment completed as documented. Education completed at bedside with pt. Pt transferred S/P with Min CGA to wheelchair to use bathroom after assessment completed.

## 2017-06-17 NOTE — H&P (Signed)
Physical Medicine and Rehabilitation Admission H&P    Chief Complaint  Patient presents with  .  Stroke with deficits in mobility/ADLs  . Back Pain X months    HPI:  Leslie Simmons is a 51 y.o. female with history fo HTN, hemorrhagic stroke with residual right sided weakness/numbness and seizure who was admitted on 06/13/17 with reports of back pain and LLE weakness since the night before. MRI brain done revealing acute nonhemorrhagic infarct involving lateral right midbrain at right corticospinal tract and  Remote L-MCA and basal ganglia infarcts. MRI lumbar spine done revealing L4 onL5 anterolisthesis with severe canal and bilateral subarticular stenosis and left subarticular  Disc protrusion L5-S1 with impingement of S1 nerve. Work up ongoing and NS consulted for input on stenosis. Dr.  Ronnald Ramp recommended therapy and elected decompression in the future. Therapy evaluations initiated revealing deficits in gait, ability to ADLs, lability and mild dysarthria with mild deficits in working memory. CIR recommended by rehab team   Review of Systems  HENT: Negative for hearing loss and tinnitus.   Eyes: Positive for blurred vision (wears glasses).  Respiratory: Negative for cough and shortness of breath.   Cardiovascular: Negative for chest pain and palpitations.  Gastrointestinal: Negative for constipation and heartburn.  Genitourinary: Negative for dysuria and urgency.  Musculoskeletal: Positive for back pain and myalgias.  Skin: Negative for itching and rash.  Neurological: Positive for sensory change (sensory deficits right hand/leg--old.  Left foot numbness), speech change, weakness and headaches. Negative for dizziness.  Psychiatric/Behavioral: Positive for memory loss. The patient has insomnia (chronic).       Past Medical History:  Diagnosis Date  . Hypertension   . Stroke [facility])     History reviewed. No pertinent surgical history.    Family History  Problem Relation Age of Onset   . Stroke Son     Social History:  Was living with family in [place] till a few weeks ago and hopes to go back there. Was visiting her boyfriend for a couple weeks prior to stroke.  Daughter (and her family)  has moved to [place] and plans on assisting after discharge? She does not use any alcohol, tobacco or illicit drugs.    Allergies  Allergen Reactions  . Gabapentin Swelling  . Codeine Rash    Medications Prior to Admission  Medication Sig Dispense Refill  . aspirin EC 81 MG tablet Take 81 mg by mouth daily.    Marland Kitchen atorvastatin (LIPITOR) 10 MG tablet Take 10 mg by mouth at bedtime.    . Cyanocobalamin (VITAMIN B-12) 1000 MCG SUBL Place 1,000 mcg under the tongue daily.    Marland Kitchen docusate sodium (COLACE) 100 MG capsule Take 100 mg by mouth daily.    . ferrous sulfate 325 (65 FE) MG tablet Take 325 mg by mouth daily.    . hydrochlorothiazide (HYDRODIURIL) 25 MG tablet Take 25 mg by mouth every morning.    Marland Kitchen ibuprofen (ADVIL,MOTRIN) 600 MG tablet Take 600 mg by mouth every 8 (eight) hours as needed for pain.    Marland Kitchen lamoTRIgine (LAMICTAL) 100 MG tablet Take 200 mg by mouth daily.    . metoprolol tartrate (LOPRESSOR) 100 MG tablet Take 100 mg by mouth 2 (two) times daily.    . valsartan (DIOVAN) 320 MG tablet Take 320 mg by mouth daily.    Marland Kitchen zolpidem (AMBIEN) 10 MG tablet Take 10 mg by mouth at bedtime.      Home: Home Living Family/patient expects to be discharged  to:: Private residence Living Arrangements: Spouse/significant other, Children Available Help at Discharge: Family, Available PRN/intermittently Type of Home: House Home Access: Level entry (per pt) Entrance Stairs-Number of Steps: 2 Home Layout: One level Bathroom Shower/Tub: [place] unit, Multimedia programmer: Standard Bathroom Accessibility: Yes Home Equipment: Environmental consultant - 4 wheels Additional Comments: pt lives in [place] With: Family (pt has given different information to PT than OT has receive)     Functional History: Prior Function Level of Independence: Independent  Functional Status:  Mobility: Bed Mobility Overal bed mobility: Needs Assistance Bed Mobility: Supine to Sit, Sit to Supine Supine to sit: Min assist, Mod assist (min to lift trunk then mod to finish scooting to EOB) Sit to supine: Mod assist, Max assist Sit to sidelying: Mod assist General bed mobility comments: up in the chair Transfers Overall transfer level: Needs assistance Equipment used: Rolling walker (2 wheeled), 1 person hand held assist Transfers: Sit to/from Stand Sit to Stand: Min assist, Min guard Stand pivot transfers: Mod assist General transfer comment: worked on sit to stand to sit technique especially coming forward to stand and sit effortlessly and with control Ambulation/Gait Ambulation/Gait assistance: Min assist Ambulation Distance (Feet): 100 Feet Assistive device: Rolling walker (2 wheeled) Gait Pattern/deviations: Step-to pattern, Step-through pattern, Decreased step length - right, Decreased stance time - left, Decreased step length - left, Decreased stride length General Gait Details: Transition from step to to step through with concentration on heel toe pattern and some pelvic translation .  As muscles fatigued, trunk lost control, needing stability support. Gait velocity: reduced Gait velocity interpretation: at or above normal speed for age/gender    ADL: ADL Overall ADL's : Needs assistance/impaired Eating/Feeding: Sitting, Minimal assistance Eating/Feeding Details (indicate cue type and reason): Issued built up handles. Pt performed self feed using built up handle on spoon to scoop apple sauce. Pt demonstrating increased fucntional performance and stabilizes apple sauce contianer with L hand while self feeding with R.  Min A provided to open apple sauce container Grooming: Minimal assistance Grooming Details (indicate cue type and reason): Discussed that built up handle may be  used on grooming utensils.  Upper Body Bathing: Minimal assistance, Sitting Lower Body Bathing: Moderate assistance, Sit to/from stand Upper Body Dressing : Moderate assistance, Sitting Lower Body Dressing: Moderate assistance, Sit to/from stand Toilet Transfer: RW, Moderate assistance (LLE buckling at times) Toileting- Clothing Manipulation and Hygiene: Moderate assistance, Sit to/from stand Functional mobility during ADLs: Moderate assistance, Rolling walker, Cueing for safety General ADL Comments: Focused on FM skills, targeted grasp, and using built up handle for self feeding, grooming, and writing.  Cognition: Cognition Overall Cognitive Status: Within Functional Limits for tasks assessed Arousal/Alertness: Awake/alert Orientation Level: Oriented X4 Attention: Sustained Sustained Attention: Appears intact Memory: Impaired Memory Impairment: Retrieval deficit Awareness: Appears intact Safety/Judgment: Appears intact Cognition Arousal/Alertness: Awake/alert Behavior During Therapy: WFL for tasks assessed/performed Overall Cognitive Status: Within Functional Limits for tasks assessed General Comments: Pt follows instructions with extra time   Blood pressure 128/83, pulse 78, temperature 98.7 F (37.1 C), temperature source Oral, resp. rate 20, height 5' (1.524 m), weight 80.5 kg (177 lb 7.5 oz), SpO2 97 %. Physical Exam  Nursing note and vitals reviewed. Constitutional: She is oriented to person, place, and time. She appears well-developed and well-nourished. No distress.  HENT:  Head: Normocephalic and atraumatic.  Mouth/Throat: Oropharynx is clear and moist.  Eyes: Pupils are equal, round, and reactive to light. Conjunctivae and EOM are normal.  Neck:  Normal range of motion. Neck supple.  Cardiovascular: Normal rate, regular rhythm and intact distal pulses.   Respiratory: Effort normal and breath sounds normal. No stridor. No respiratory distress. She has no wheezes.  GI:  Soft. Bowel sounds are normal. She exhibits no distension. There is no tenderness.  Musculoskeletal: She exhibits no edema or tenderness.  Neurological: She is alert and oriented to person, place, and time.  Speech slow with mild ataxia. Able to follow basic commands but slow to process. Has poor insight into deficits.   LUE>RUE weakness with ataxia. Decrease in [facility] BLE.  DTR 2+   Skin: Skin is warm and dry. She is not diaphoretic.  Psychiatric: She has a normal mood and affect. She is slowed. She expresses inappropriate judgment.    Results for orders placed or performed during the hospital encounter of 06/13/17 (from the past 48 hour(s))  Glucose, capillary     Status: Abnormal   Collection Time: 06/16/17 12:25 PM  Result Value Ref Range   Glucose-Capillary 146 (H) 65 - 99 mg/dL   No results found.     Medical Problem List and Plan: 1.  Left sided weakness and functional deficits secondary to right midbrain infarct, lumbosacral spondylosis with radiculopathy  -admit to inpatient rehab 2.  DVT Prophylaxis/Anticoagulation: Pharmaceutical: Lovenox 3. Chronic back pain/Pain Management: Hydrocodone effective. Will also order local measures.  4. Mood: LCSW to follow for evaluate and support.  5. Neuropsych: This patient is not fully capable of making decisions on her own behalf. 6. Skin/Wound Care: routine pressure relief measures.  7. Fluids/Electrolytes/Nutrition: Monitor I/O. Check lytes in am.  8. HTN: Monitor BP bid. Continue 9. Dyslipidemia: On lipitor.  10. Seizure disorder: On lamictal.  11. H/o recurrent strokes: Hypercoagulopathy panel negative.  12. Chronic insomnia: uses ambien at home.  13. Constipation: resolved with miralax.    Post Admission Physician Evaluation: 1. Functional deficits secondary  to right midbrain infact. 2. Patient is admitted to receive collaborative, interdisciplinary care between the physiatrist, rehab nursing staff, and therapy  team. 3. Patient's level of medical complexity and substantial therapy needs in context of that medical necessity cannot be provided at a lesser intensity of care such as a SNF. 4. Patient has experienced substantial functional loss from his/her baseline which was documented above under the "Functional History" and "Functional Status" headings.  Judging by the patient's diagnosis, physical exam, and functional history, the patient has potential for functional progress which will result in measurable gains while on inpatient rehab.  These gains will be of substantial and practical use upon discharge  in facilitating mobility and self-care at the household level. 5. Physiatrist will provide 24 hour management of medical needs as well as oversight of the therapy plan/treatment and provide guidance as appropriate regarding the interaction of the two. 6. The Preadmission Screening has been reviewed and patient status is unchanged unless otherwise stated above. 7. 24 hour rehab nursing will assist with bladder management, bowel management, safety, skin/wound care, disease management, medication administration, pain management and patient education  and help integrate therapy concepts, techniques,education, etc. 8. PT will assess and treat for/with: Lower extremity strength, range of motion, stamina, balance, functional mobility, safety, adaptive techniques and equipment, NMR.   Goals are: mod I. 9. OT will assess and treat for/with: ADL's, functional mobility, safety, upper extremity strength, adaptive techniques and equipment, NMR, family ed.   Goals are: mod I. Therapy may proceed with showering this patient. 10. SLP will assess and treat for/with: speech,  cognition.  Goals are: mod I. 11. Case Management and Social Worker will assess and treat for psychological issues and discharge planning. 12. Team conference will be held weekly to assess progress toward goals and to determine barriers to  discharge. 13. Patient will receive at least 3 hours of therapy per day at least 5 days per week. 14. ELOS: 13-19 days       15. Prognosis:  excellent     Meredith Staggers, MD, [place] [facility] 06/17/2017  Bary Leriche, Hershal Coria 06/17/2017

## 2017-06-17 NOTE — IPOC Note (Signed)
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 (HCC) Active Problems:   Lumbar radiculopathy   Chronic bilateral low back pain with sciatica   Benign essential HTN   Seizures ([place])   Primary insomnia   Prediabetes     Functional Problem List: Nursing Behavior, Endurance, Medication Management, Sensory, Pain  PT Balance, Endurance, Motor, Pain, Sensory, Behavior, Safety  OT Balance, Cognition, Motor, Pain, Sensory  SLP    TR         Basic ADL's: OT Grooming, Bathing, Dressing, Toileting     Advanced  ADL's: OT Simple Meal Preparation     Transfers: PT Bed Mobility, Bed to Chair, Car, Furniture, Floor  OT Toilet, Metallurgist: PT Ambulation, Emergency planning/management officer, Stairs     Additional Impairments: OT Fuctional Use of Upper Extremity  SLP None      TR      Anticipated Outcomes Item Anticipated Outcome  Self Feeding modified independent  Swallowing      Basic self-care  modified independent  Toileting  modified independent   Bathroom Transfers modified independent  Bowel/Bladder     Transfers  Mod I  Locomotion  Mod I  Communication     Cognition     Pain  Pain < or 2 on a 0-10 scale  Safety/Judgment      Therapy Plan: PT Intensity: Minimum of 1-2 x/day ,45 to 90 minutes PT Frequency: 5 out of 7 days PT Duration Estimated Length of Stay: 7-10 days OT Intensity: Minimum of 1-2 x/day, 45 to 90 minutes OT Frequency: 5 out of 7 days OT Duration/Estimated Length of Stay: 7-9 days      Team Interventions: Nursing Interventions Patient/Family Education, Pain Management, Medication Management, Cognitive Remediation/Compensation, Disease Management/Prevention, Discharge Planning, Psychosocial Support  PT interventions Ambulation/gait training, Human resources officer, Community reintegration, Technical sales engineer stimulation, Patient/family education, IT trainer, UE/LE Coordination activities, UE/LE Strength taining/ROM, Splinting/orthotics, Pain management, DME/adaptive equipment instruction, Disease management/prevention, Neuromuscular re-education, Therapeutic Exercise, Wheelchair propulsion/positioning, Therapeutic Activities, Functional mobility training, Discharge planning, Visual/perceptual remediation/compensation  OT Interventions Training and development officer, Cognitive remediation/compensation, Community reintegration, DME/adaptive equipment instruction, Discharge planning, Pain management, Self Care/advanced ADL retraining, Therapeutic Activities, UE/LE Coordination activities, Therapeutic Exercise, Patient/family education, Functional mobility training, Neuromuscular re-education, UE/LE Strength taining/ROM  SLP Interventions    TR Interventions    SW/CM Interventions Discharge Planning, Psychosocial Support, Patient/Family Education   Barriers to Discharge MD  Medical stability and Lack of/limited family support  Nursing Home environment access/layout, Behavior, Medical stability Inconsistent history/background from patient, family, file; where pt is going to live after discharge and who is going to provide any necessary care  PT Decreased caregiver support    OT Decreased caregiver support    SLP      SW       Team Discharge Planning: Destination: PT-Home ,OT- Home , SLP-  Projected Follow-up: PT-Home health PT, OT-  Home health OT, SLP-None Projected Equipment Needs: PT-To be determined, OT- To be determined, SLP-None recommended by SLP Equipment Details: PT- , OT-  Patient/family involved in discharge planning: PT- Patient,  OT-Patient, SLP-Patient  MD ELOS: 7-10 days. Medical Rehab Prognosis:  Good Assessment: 51 y.o.femalewith history fo HTN, hemorrhagic stroke with residual right sided weakness/numbness and seizure  who was admitted on 06/13/17 with reports of back pain and LLE weakness. MRI brain  done revealing acute nonhemorrhagic infarct involving lateral right midbrain at right corticospinal tract and Remote L-MCA and basal ganglia infarcts.MRI lumbar spine done revealing L4 onL5 anterolisthesis with severe canal and bilateral subarticular stenosis and left subarticular Disc protrusion L5-S1 with impingement of S1 nerve. NS consulted for input on stenosis. Dr. Ronnald Ramp recommended therapy and elected decompression in the future. Pt with resulting functional deficits with mobility, transfers, and self-care.  Will set goals for Mod I with PT/OT.   See Team Conference Notes for weekly updates to the plan of care

## 2017-06-17 NOTE — Progress Notes (Signed)
Physical Therapy Treatment Patient Details Name: Leslie Simmons MRN: [id number] DOB: Mar 10, 1966 Today's Date: 06/17/2017    History of Present Illness 51 y.o. female  with PMH significant for hypertension, prior Left MCA stroke ( ?? Hemorrhagic per patient)  with residual right-sided hemiparesis presents to the emergency room for left leg weakness and back pain that began on 8.29.18 evening.MRI brain showed a right midbrain stroke. MRI of lumbar showed severe stenosis at L4-5 with grade 1 spondylolisthesis, L5-S1 herniated disc, and moderate stenosis at L3-4.     PT Comments    Emphasis on pre-gait, sit to stand to sit and assisted gait training.   Follow Up Recommendations  CIR     Equipment Recommendations  None recommended by PT    Recommendations for Other Services Rehab consult     Precautions / Restrictions Precautions Precautions: Fall    Mobility  Bed Mobility               General bed mobility comments: up in the chair  Transfers Overall transfer level: Needs assistance Equipment used: Rolling walker (2 wheeled);1 person hand held assist Transfers: Sit to/from Stand Sit to Stand: Min assist;Min guard         General transfer comment: worked on sit to stand to sit technique especially coming forward to stand and sit effortlessly and with control  Ambulation/Gait Ambulation/Gait assistance: Min assist Ambulation Distance (Feet): 100 Feet Assistive device: Rolling walker (2 wheeled) Gait Pattern/deviations: Step-to pattern;Step-through pattern;Decreased step length - right;Decreased stance time - left;Decreased step length - left;Decreased stride length Gait velocity: reduced Gait velocity interpretation: at or above normal speed for age/gender General Gait Details: Transition from step to to step through with concentration on heel toe pattern and some pelvic translation .  As muscles fatigued, trunk lost control, needing stability support.   Stairs             Wheelchair Mobility    Modified Rankin (Stroke Patients Only)       Balance   Sitting-balance support: No upper extremity supported Sitting balance-Leahy Scale: Fair       Standing balance-Leahy Scale: Poor                              Cognition Arousal/Alertness: Awake/alert Behavior During Therapy: WFL for tasks assessed/performed Overall Cognitive Status: Within Functional Limits for tasks assessed                                        Exercises      General Comments        Pertinent Vitals/Pain Faces Pain Scale: Hurts little more Pain Location: low back Pain Descriptors / Indicators: Sore Pain Intervention(s): Monitored during session    Home Living                      Prior Function            PT Goals (current goals can now be found in the care plan section) Acute Rehab PT Goals Patient Stated Goal: get stronger and get home PT Goal Formulation: With patient Time For Goal Achievement: 06/28/17 Potential to Achieve Goals: Good Progress towards PT goals: Progressing toward goals    Frequency    Min 3X/week      PT Plan Current plan remains appropriate    Co-evaluation  AM-PAC PT "6 Clicks" Daily Activity  Outcome Measure  Difficulty turning over in bed (including adjusting bedclothes, sheets and blankets)?: Unable   Difficulty sitting down on and standing up from a chair with arms (e.g., wheelchair, bedside commode, etc,.)?: A Little Help needed moving to and from a bed to chair (including a wheelchair)?: A Little Help needed walking in hospital room?: A Lot Help needed climbing 3-5 steps with a railing? : A Lot 6 Click Score: 11    End of Session   Activity Tolerance: Patient tolerated treatment well Patient left: in chair;with call bell/phone within reach;with family/visitor present Nurse Communication: Mobility status PT Visit Diagnosis: Unsteadiness on feet  (R26.81);Repeated falls (R29.6)     Time: 6067-7034 PT Time Calculation (min) (ACUTE ONLY): 18 min  Charges:  $Gait Training: 8-22 mins                    G Codes:       2017/06/27  Donnella Sham, PT [phone]  (pager)   Tessie Fass Xayvier Vallez Jun 27, 2017, 10:39 AM

## 2017-06-17 NOTE — Progress Notes (Signed)
Leslie Gong, RN Rehab Admission Coordinator Signed Physical Medicine and Rehabilitation  PMR Pre-admission Date of Service: 06/17/2017 11:00 AM  Related encounter: ED to Hosp-Admission (Discharged) from 06/13/2017 in [place] [facility]       [] Hide copied text PMR Admission Coordinator Pre-Admission Assessment  Patient: Leslie Simmons is an 51 y.o., female MRN: [id number] DOB: 04/10/1966 Height: 5' (152.4 cm) Weight: 80.5 kg (177 lb 7.5 oz)                                                                                                                                                  Insurance Information HMO:     PPO:      PCP:      IPA:      80/20: yes     OTHER:  No HMO PRIMARY: Medicare a and n      Policy#: [id number]      Subscriber: pt Benefits:  Phone #: passport one online listed as Leslie Simmons     Name: 06/17/2017 Eff. Date: 02/12/2014     Deduct: $1340      Out of Pocket Max: none      Life Max: none CIR: 100%      SNF: 20 full days Outpatient: 80%     Co-Pay: 20% Home Health: 100%      Co-Pay: none DME: 80%     Co-Pay: 20% Providers: pt choice  SECONDARY: [place] (Medical)      Policy#: [id number]      Subscriber: pt  Medicaid Application Date:       Case Manager:  Disability Application Date:       Case Worker:  Pt states her secondary is [place]. She is moving permanently to  with boyfriend, and he is needing assistance or direction on how to establish her residency in [place] and to apply for [facility] medicaid  Emergency Contact Information        Contact Information    Name Relation Home Work Mobile   Sawyer,Charles Significant other [phone]       Current Medical History  Patient Admitting Diagnosis: right midbrain/corticospinal infarct, lumbosacral spondylosis with radiculopathy  History of Present Illness:  HPI: Leslie Simmons a 51 y.o.femalewith history of HTN, hemorrhagic stroke with  residual right sided weakness/numbnessand seizure who was admitted on 06/13/17 with reports of back pain and LLE weakness since the night before. MRI brain done revealing acute nonhemorrhagic infarct involving lateral right midbrain at right corticospinal tract and Remote L-MCA and basal ganglia infarcts.MRI lumbar spine done revealing L4 on L5 anterolisthesis with severe canal and bilateral subarticular stenosis and left subarticular Disc protrusion L5-S1 with impingement of S1 nerve. Work up ongoing and NS consulted for input on stenosis. Dr. Ronnald Ramp recommended therapy and elected decompression in the future. Therapy  evaluations initiated revealing deficits in gait, ability to ADLs, lability and mild dysarthria with mild deficits in working memory. CIR recommended by rehab team  Total: 1 NIHSS  Past Medical History      Past Medical History:  Diagnosis Date  . Hypertension   . Stroke [facility])     Family History  family history includes Stroke in her son.  Prior Rehab/Hospitalizations:  Has the patient had major surgery during 100 days prior to admission? No  Current Medications   Current Facility-Administered Medications:  .  acetaminophen (TYLENOL) tablet 650 mg, 650 mg, Oral, Q4H PRN **OR** [DISCONTINUED] acetaminophen (TYLENOL) solution 650 mg, 650 mg, Per Tube, Q4H PRN **OR** [DISCONTINUED] acetaminophen (TYLENOL) suppository 650 mg, 650 mg, Rectal, Q4H PRN, Opyd, Ilene Qua, MD .  aspirin EC tablet 325 mg, 325 mg, Oral, Daily, Rosalin Hawking, MD, 325 mg at 06/17/17 0841 .  atorvastatin (LIPITOR) tablet 80 mg, 80 mg, Oral, q1800, Opyd, Ilene Qua, MD, 80 mg at 06/16/17 1745 .  clopidogrel (PLAVIX) tablet 75 mg, 75 mg, Oral, Daily, Aroor, Lanice Schwab, MD, 75 mg at 06/17/17 0842 .  diphenhydrAMINE (BENADRYL) capsule 25 mg, 25 mg, Oral, Q6H PRN, Raiford Noble Latif, DO, 25 mg at 06/16/17 1745 .  docusate sodium (COLACE) capsule 100 mg, 100 mg, Oral, Daily, Opyd, Ilene Qua, MD, 100 mg  at 06/17/17 0841 .  ferrous sulfate tablet 325 mg, 325 mg, Oral, Q breakfast, Opyd, Ilene Qua, MD, 325 mg at 06/17/17 0841 .  HYDROcodone-acetaminophen (NORCO/VICODIN) 5-325 MG per tablet 1-2 tablet, 1-2 tablet, Oral, Q6H PRN, Opyd, Ilene Qua, MD, 2 tablet at 06/17/17 0842 .  labetalol (NORMODYNE,TRANDATE) injection 5 mg, 5 mg, Intravenous, Q2H PRN, Opyd, Timothy S, MD .  lamoTRIgine (LAMICTAL) tablet 200 mg, 200 mg, Oral, Daily, Opyd, Ilene Qua, MD, 200 mg at 06/17/17 0843 .  polyethylene glycol (MIRALAX / GLYCOLAX) packet 17 g, 17 g, Oral, BID, Raiford Noble [place], DO, 17 g at 06/17/17 6063 .  senna-docusate (Senokot-S) tablet 1 tablet, 1 tablet, Oral, QHS PRN, Opyd, Ilene Qua, MD, 1 tablet at 06/16/17 0917 .  vitamin B-12 (CYANOCOBALAMIN) tablet 1,000 mcg, 1,000 mcg, Oral, Daily, Opyd, Ilene Qua, MD, 1,000 mcg at 06/17/17 0842 .  zolpidem (AMBIEN) tablet 5 mg, 5 mg, Oral, QHS, Opyd, Ilene Qua, MD, 5 mg at 06/16/17 2304  Patients Current Diet: Diet Heart Room service appropriate? Yes; Fluid consistency: Thin  Precautions / Restrictions Precautions Precautions: Fall Precaution Comments: ck BP and pulses Restrictions Weight Bearing Restrictions: No   Has the patient had 2 or more falls or a fall with injury in the past year?No  Prior Activity Level Community (5-7x/wk): Independent without AD; does not drive  Development worker, international aid / [place] Devices/Equipment: None Home Equipment: Walker - 4 wheels  Prior Device Use: Indicate devices/aids used by the patient prior to current illness, exacerbation or injury? None of the above  Prior Functional Level Prior Function Level of Independence: Independent Comments: daughter did housekeeping and cooking.   Self Care: Did the patient need help bathing, dressing, using the toilet or eating?  Independent  Indoor Mobility: Did the patient need assistance with walking from room to room (with or without device)?  Independent  Stairs: Did the patient need assistance with internal or external stairs (with or without device)? Independent  Functional Cognition: Did the patient need help planning regular tasks such as shopping or remembering to take medications? Needed some help  Current Functional Level Cognition  Arousal/Alertness: Awake/alert  Overall Cognitive Status: Within Functional Limits for tasks assessed Orientation Level: Oriented X4 General Comments: Pt follows instructions with extra time Attention: Sustained Sustained Attention: Appears intact Memory: Impaired Memory Impairment: Retrieval deficit Awareness: Appears intact Safety/Judgment: Appears intact    Extremity Assessment (includes Sensation/Coordination)  Upper Extremity Assessment: Defer to OT evaluation RUE Deficits / Details: residual weakness and incoordination form previous CVa. Uses as functional assist RUE Sensation: decreased light touch, decreased proprioception RUE Coordination: decreased fine motor LUE Deficits / Details: LUE generalized weakness. Stronger proximally. Able to complete full ROM with isolated movements out of synergy. Difficulty with coordinated movements adn in hand manipulation skills. Hand slipping off chair when pushing up. Able to maintain grasp on RW. LUE Sensation: decreased light touch LUE Coordination: decreased fine motor  Lower Extremity Assessment: LLE deficits/detail LLE Deficits / Details: Strength in L knee and ankle were low, 3- quads and 2 DF, hip 2+ and has difficulty using LLE to take a step LLE Sensation: decreased light touch LLE Coordination: decreased fine motor, decreased gross motor    ADLs  Overall ADL's : Needs assistance/impaired Eating/Feeding: Sitting, Minimal assistance Eating/Feeding Details (indicate cue type and reason): Issued built up handles. Pt performed self feed using built up handle on spoon to scoop apple sauce. Pt demonstrating increased fucntional  performance and stabilizes apple sauce contianer with L hand while self feeding with R.  Min A provided to open apple sauce container Grooming: Minimal assistance Grooming Details (indicate cue type and reason): Discussed that built up handle may be used on grooming utensils.  Upper Body Bathing: Minimal assistance, Sitting Lower Body Bathing: Moderate assistance, Sit to/from stand Upper Body Dressing : Moderate assistance, Sitting Lower Body Dressing: Moderate assistance, Sit to/from stand Toilet Transfer: RW, Moderate assistance (LLE buckling at times) Toileting- Clothing Manipulation and Hygiene: Moderate assistance, Sit to/from stand Functional mobility during ADLs: Moderate assistance, Rolling walker, Cueing for safety General ADL Comments: Focused on FM skills, targeted grasp, and using built up handle for self feeding, grooming, and writing.    Mobility  Overal bed mobility: Needs Assistance Bed Mobility: Supine to Sit, Sit to Supine Supine to sit: Min assist, Mod assist (min to lift trunk then mod to finish scooting to EOB) Sit to supine: Mod assist, Max assist Sit to sidelying: Mod assist General bed mobility comments: up in the chair    Transfers  Overall transfer level: Needs assistance Equipment used: Rolling walker (2 wheeled), 1 person hand held assist Transfers: Sit to/from Stand Sit to Stand: Min assist, Min guard Stand pivot transfers: Mod assist General transfer comment: worked on sit to stand to sit technique especially coming forward to stand and sit effortlessly and with control    Ambulation / Gait / Stairs / Wheelchair Mobility  Ambulation/Gait Ambulation/Gait assistance: Museum/gallery curator (Feet): 100 Feet Assistive device: Rolling walker (2 wheeled) Gait Pattern/deviations: Step-to pattern, Step-through pattern, Decreased step length - right, Decreased stance time - left, Decreased step length - left, Decreased stride length General Gait  Details: Transition from step to to step through with concentration on heel toe pattern and some pelvic translation .  As muscles fatigued, trunk lost control, needing stability support. Gait velocity: reduced Gait velocity interpretation: at or above normal speed for age/gender    Posture / Balance Balance Overall balance assessment: Needs assistance Sitting-balance support: No upper extremity supported Sitting balance-Leahy Scale: Fair Standing balance-Leahy Scale: Poor Standing balance comment: heavy reliance on RW    Special needs/care consideration BiPAP/CPAP  N/a CPM  N/a Continuous Drip IV  N/a Dialysis  N/a Life Vest  N/a Oxygen  N/a Special Bed  N/a Trach Size n/a Wound Vac n/a Skin  Intact  Bowel mgmt: continent LBM 06/16/17 Bladder mgmt: continent Diabetic mgmt Hgb A1c 5.9   Previous Home Environment Living Arrangements: Spouse/significant other (moved here a month ago to be with her boyfriend, Juanda Crumble)  Lives With: Family (lived with daughter and her spouse in [place], they have now tran) Available Help at Discharge:  Juanda Crumble works 4 am until 230 pm daily) Type of Home: Apartment (apartment in [place] with daughter) Home Layout: Other (Comment) Home Access: Level entry (per pt) Entrance Stairs-Number of Steps: 2 Bathroom Shower/Tub: Tub/shower unit, Multimedia programmer: Associate Professor Accessibility: Yes How Accessible: Accessible via walker [place]: No Additional Comments: pt lived in apartment with daughter in [place] was in [place] with Juanda Crumble for a month and plans to move in with him permanently. His address is [address], [place]. Daughter who she was living with in [place] has since moved to [place] with her spouse as they are Nature conservation officer and now stationed there  Discharge Living Setting Plans for Discharge Living Setting: House (is moving in with Juanda Crumble, Boyfriend) Type of Home at Discharge: House Discharge Home Layout:  One level Discharge Home Access:  (one step) Discharge Bathroom Shower/Tub: Tub/shower unit, Curtain, Walk-in shower (has both) Discharge Bathroom Toilet: Standard Discharge Bathroom Accessibility: Yes How Accessible: Accessible via walker Does the patient have any problems obtaining your medications?:  (does not have PCP in [place]; moving here permanently)  Social/Family/Support Systems Patient Roles: Parent, Partner Contact Information: Juanda Crumble, boyfriend and daughter, Destiny Anticipated Caregiver: Juanda Crumble Anticipated Ambulance person Information: see above Ability/Limitations of Caregiver: works daily Caregiver Availability: Evenings only Discharge Plan Discussed with Primary Caregiver: Yes Is Caregiver In Agreement with Plan?: Yes Does Caregiver/Family have Issues with Lodging/Transportation while Pt is in Rehab?: No  Goals/Additional Needs Patient/Family Goal for Rehab: Mod I with PT, OT, and SLP Expected length of stay: ELOS 13- 19 days Special Service Needs: Needs her Medicaid of [place] siched to [place] in [place] and Medicaid [place] Pt/Family Agrees to Admission and willing to participate: Yes Program Orientation Provided & Reviewed with Pt/Caregiver Including Roles  & Responsibilities: Yes  Barriers to Discharge: Decreased caregiver support, Pending surgery (lumbar fusion after recovery from PCP)  Decrease burden of Care through IP rehab admission: n/a   Possible need for SNF placement upon discharge: not anticipated  Patient Condition: This patient's medical and functional status has changed since the consult dated: 06/14/2017 in which the Rehabilitation Physician determined and documented that the patient's condition is appropriate for intensive rehabilitative care in an inpatient rehabilitation facility. See "History of Present Illness" (above) for medical update. Functional changes are: overall min to mod assist. Patient's medical and functional status update has  been discussed with the Rehabilitation physician and patient remains appropriate for inpatient rehabilitation. Will admit to inpatient rehab today.  Preadmission Screen Completed By:  Cleatrice Burke, 06/17/2017 11:12 AM ______________________________________________________________________   Discussed status with Dr. Naaman Plummer on 06/17/2017 at  1111 and received telephone approval for admission today.  Admission Coordinator:  Cleatrice Burke, time 4010 Date 06/17/2017       Cosigned by: Meredith Staggers, MD at 06/17/2017 12:25 PM  Revision History

## 2017-06-17 NOTE — Discharge Summary (Signed)
Physician Discharge Summary  Leslie Simmons ZOX:[id number] DOB: 1966/05/03 DOA: 06/13/2017  PCP: System, Pcp Not In  Admit date: 06/13/2017 Discharge date: 06/17/2017  Admitted From: Home Disposition:  CIR  Recommendations for Outpatient Follow-up:  1. Follow up with PCP in 1-2 weeks 2. 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 Follow up with Neurosurgery within 1-2 weeks after Discharge 4. Allow for Permissive HTN and Have CIR restart Anti-hypertensive medications for  5. Please obtain CMP/CBC, Mag, Phos within one week  Home Health: No Equipment/Devices: None Recommended by PT    Discharge Condition: Stable CODE STATUS: FULL CODE Diet recommendation: Heart Healthy Diet   Brief/Interim Summary: Leslie Simmons a 51 y.o.femalewith medical history significant for hypertension, 2 hemorrhagic strokes with residual right-sided motor deficits, and history of seizures and other comorbids now presenting to the emergency department for evaluation of left leg weakness and low back pain. Patient reports that she had been in her usual state of health until last night when she noted the development of weakness involving the left leg. She reports that there is also some discomfort in the leg, but mainly in her low back. She has had the low back pain previously, but seems to be worsening. She has persistent right-sided weakness from her old strokes, but the left leg weakness is new as of last night. She denies any fall or trauma, denies saddle anesthesia or incontinence, and denies chest pain or palpitations. There is no headache, change in vision or hearing, or confusion.  MRI brain was performed and reveals a 1 cm acute ischemic nonhemorrhagic infarct involving the lateral right mid brain at the level of the cortical spinal tract. There is no mass effect on MRI. MRI lumbar spine was also performed and notable for severe  canal stenosis at L4-5 and moderate stenosis at L3-4. Patient was treated with 365milligram aspirin in the emergency department and neurosurgery was consulted by the ED physician. Neurosurgery evaluated patient and recommends Neurology consultation, suspecting that the leg weakness is related to the brain lesion. Neurology has been consulted and requests a medical admission. Patient was admitted for new left leg weakness with acute ischemic stroke on MRI brain and severe lumbar canal stenosis on MRI L-spine. Stroke worked up and now patient being transferred to CIR at the recommendation of PT/OT.   Discharge Diagnoses:  Principal Problem:   Acute ischemic stroke [facility]) Active Problems:   Lumbar spinal stenosis   Hypertensive urgency   Essential hypertension   History of hemorrhagic stroke with residual hemiparesis (HCC)   History of seizure   Left leg weakness   History of stroke   Hyperlipidemia   Constipation  1. Acute Ischemic CVA of the Lateral Midbrain  - Pt presents with left leg weakness that began the night prior  - Head CT showed No evidence of acute intracranial abnormality. Chronic left parietal and basal ganglia infarcts. - MRI brain showed 1 cm acute ischemic nonhemorrhagic infarct involving the lateral right midbrain at the level the right corticospinal tract. No associated mass effect. Remote posterior left MCA territory and left basal ganglia infarcts as above. - MRA Head w/o Contrast showed Occluded RIGHT posterior cerebral artery at P2 origin, mild collateralization. Occluded LEFT middle cerebral artery at M2 origin with poor Collateralization. Severe stenosis proximal RIGHT M2 segment and proximal LEFT P3 Segment. Focal irregularity and probable stenosis RIGHT supraclinoid internal carotid artery. 3 mm RIGHT  posterior communicating artery origin aneurysm. - CTA Head showed Severe stenosis RIGHT internal carotid artery at anterior genu/supraclinoid segment due to advanced  atherosclerosis. 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 Occluded RIGHT PCA at proximal P2 segment, poor collateralization and, likely acute given recent MRI findings. Severe stenosis RIGHT M2 and LEFT P3 segments. Moderate stenosis RIGHT M1 segment. 2 mm RIGHT PCOM origin aneurysm. - CTA Neck showed Atherosclerosis without hemodynamically significant stenosis or acute vascular process in the neck. Suspected cardiomegaly and pulmonary edema. Degenerative cervical spine resulting in severe canal stenosis C5-6. Moderate to severe C5-6 and C6-7 neural foraminal narrowing - ECHOCardiogram and showed The cavity size was normal. Wall thickness was increased in a pattern of mild LVH. Systolic function was vigorous. The estimated ejection fraction was in the range of 65% to 70%. - Neurology is consulted and appreciated Recc's  - Plan to continue cardiac monitoring - Lipid Panel showed Cholesterol of 141, HDL of 43, LDL of 83, TG of 74, VLDL 15 - Hemoglobin A1c was 5.9 - Hold antihypertensives and treat with prn agents if SBP >210  - C/w ASA 325 mg po Daily, Clopidogrel 75 mg po daily and Atorvastatin 80 mg po Daily - Obtain PT/OT/SLP - Neurology ordering Hypercoaguable Workup  - PT Eval recommending CIR; OT recommending CIR - CIR consulted and patient a candidate and now awaiting Bed Placement   2. Low Back Pain and Lumbar Spinal Stenosis - MRI L-spine with severe canal stenosis at L4-5, moderate stenosis at L3-4, and herniated disc at L5-S1  - Neurosurgery is consulting and much appreciated, suspects LLE weakness secondary to the acute stroke  - No change in bowel or bladder function, and no saddle anesthesia - Continue supportive care with prn analgesia  - Neurosurgery recommending Therapy for Strengthening and Ambulation - Will need Elective Decompression and Instrument Fusion at L4-5 with possible decompression at L3-4 and L5-S1 once over acute CVA - Follow up with Neurosurgery as an  outpatient   3. Hypertension with hypertensive urgency  - BP elevated to 175/105 in ED - Likely secondary to acute ischemic CVA  - Managed at home with Lopressor, Valsartan, and HCTZ  - Plan to hold her home antihypertensives in acute-phase of stroke, treat as needed for SBP >210 ; Metoprolol 100 mg po BID started back today - C/w Labetalol 5 mg IV q2hprn for SBP >210 - Allow for permissive HTN and BP now 128/83 - Defer to CIR Physician to restart Valsartan and HCTZ  4. Seizure Hx  - Continue Lamotrigine 200 mg po Daily  5. ?Cardiomegaly and Pulmonary Edema -Seen on CTA of Neck -CXR this AM showed The heart size and mediastinal contours are within normal limits. Both lungs are clear. The visualized skeletal structures are unremarkable. -Patient undergoing ECHOCardiogram and showed - Left ventricle: The cavity size was normal. Wall thickness was increased in a pattern of mild LVH. Systolic function wasvigorous. The estimated ejection fraction was in the range of 65% to 70%. -Repeat CXR in AM   6. Leukocytosis -Was likely reactive and now resolved. WBC went from 12.2 -> 12.1 -> 9.9 -Did not Repeat CBC today but can repeat in CIR  7. Constipation, improved -Patient had not had a bowel movement in 4 days but improved -C/w Docusate 100 mg po Daily and Senna-Docusate 1 tab po qHSprn -Added Miralax 17 grams po BID -Given Bisacodyl Suppository 10 mg RC once   8. Insomnia -C/w Ambien   9. Right Posterior Communicating Artery Aneurysm  3 mm -Follow up with Neurology as an outpatient for follow up and surveillance   Discharge Instructions  Discharge Instructions    Ambulatory referral to Neurology    Complete by:  As directed    Pt will follow up with stroke MD Erlinda Hong preferred, if not available, then consider Leonie Man, Penumalli or Ahern) at [facility] in about 2 months. Thanks.   Call MD for:  difficulty breathing, headache or visual disturbances    Complete by:  As directed    Call MD  for:  hives    Complete by:  As directed    Call MD for:  persistant dizziness or light-headedness    Complete by:  As directed    Call MD for:  persistant nausea and vomiting    Complete by:  As directed    Call MD for:  redness, tenderness, or signs of infection (pain, swelling, redness, odor or green/yellow discharge around incision site)    Complete by:  As directed    Call MD for:  severe uncontrolled pain    Complete by:  As directed    Call MD for:  temperature >100.4    Complete by:  As directed    Diet - low sodium heart healthy    Complete by:  As directed    Discharge instructions    Complete by:  As directed    Follow up Care at CIR; Antihypertensive Medications Held for Permissive HTN and will allow normalization in 5-7 days   Increase activity slowly    Complete by:  As directed      Allergies as of 06/17/2017      Reactions   Gabapentin Swelling   Codeine Rash      Medication List    STOP taking these medications   hydrochlorothiazide 25 MG tablet Commonly known as:  HYDRODIURIL   ibuprofen 600 MG tablet Commonly known as:  ADVIL,MOTRIN   valsartan 320 MG tablet Commonly known as:  DIOVAN     TAKE these medications   aspirin 325 MG EC tablet Take 1 tablet (325 mg total) by mouth daily. What changed:  medication strength  how much to take   atorvastatin 80 MG tablet Commonly known as:  LIPITOR Take 1 tablet (80 mg total) by mouth daily at 6 PM. What changed:  medication strength  how much to take  when to take this   clopidogrel 75 MG tablet Commonly known as:  PLAVIX Take 1 tablet (75 mg total) by mouth daily.   diphenhydrAMINE 25 mg capsule Commonly known as:  BENADRYL Take 1 capsule (25 mg total) by mouth every 6 (six) hours as needed for itching.   docusate sodium 100 MG capsule Commonly known as:  COLACE Take 100 mg by mouth daily.   ferrous sulfate 325 (65 FE) MG tablet Take 325 mg by mouth daily.   lamoTRIgine 100 MG  tablet Commonly known as:  LAMICTAL Take 200 mg by mouth daily.   metoprolol tartrate 100 MG tablet Commonly known as:  LOPRESSOR Take 100 mg by mouth 2 (two) times daily.   polyethylene glycol packet Commonly known as:  MIRALAX / GLYCOLAX Take 17 g by mouth 2 (two) times daily.   senna-docusate 8.6-50 MG tablet Commonly known as:  Senokot-S Take 1 tablet by mouth at bedtime as needed for mild constipation.   Vitamin B-12 1000 MCG Subl Place 1,000 mcg under the tongue daily.   zolpidem 5 MG tablet Commonly known as:  AMBIEN Take 1  tablet (5 mg total) by mouth at bedtime. What changed:  medication strength  how much to take            Discharge Care Instructions        Start     Ordered   06/18/17 0000  aspirin EC 325 MG EC tablet  Daily     06/17/17 1154   06/18/17 0000  clopidogrel (PLAVIX) 75 MG tablet  Daily     06/17/17 1154   06/17/17 0000  atorvastatin (LIPITOR) 80 MG tablet  Daily-1800     06/17/17 1154   06/17/17 0000  zolpidem (AMBIEN) 5 MG tablet  Daily at bedtime     06/17/17 1154   06/17/17 0000  diphenhydrAMINE (BENADRYL) 25 mg capsule  Every 6 hours PRN     06/17/17 1154   06/17/17 0000  polyethylene glycol (MIRALAX / GLYCOLAX) packet  2 times daily     06/17/17 1154   06/17/17 0000  senna-docusate (SENOKOT-S) 8.6-50 MG tablet  At bedtime PRN     06/17/17 1154   06/17/17 0000  Increase activity slowly     06/17/17 1154   06/17/17 0000  Diet - low sodium heart healthy     06/17/17 1154   06/17/17 0000  Discharge instructions    Comments:  Follow up Care at [facility]; Antihypertensive Medications Held for Permissive HTN and will allow normalization in 5-7 days   06/17/17 1154   06/17/17 0000  Call MD for:  temperature >100.4     06/17/17 1154   06/17/17 0000  Call MD for:  persistant nausea and vomiting     06/17/17 1154   06/17/17 0000  Call MD for:  severe uncontrolled pain     06/17/17 1154   06/17/17 0000  Call MD for:  redness, tenderness,  or signs of infection (pain, swelling, redness, odor or green/yellow discharge around incision site)     06/17/17 1154   06/17/17 0000  Call MD for:  difficulty breathing, headache or visual disturbances     06/17/17 1154   06/17/17 0000  Call MD for:  hives     06/17/17 1154   06/17/17 0000  Call MD for:  persistant dizziness or light-headedness     06/17/17 1154   06/14/17 0000  Ambulatory referral to Neurology    Comments:  Pt will follow up with stroke MD Erlinda Hong preferred, if not available, then consider Leonie Man, Penumalli or Ahern) at [facility] in about 2 months. Thanks.   06/14/17 2229     Follow-up Information    Rosalin Hawking, MD. Schedule an appointment as soon as possible for a visit in 6 week(s).   Specialty:  Neurology Contact information: [address] [place] [address] [phone]          Allergies  Allergen Reactions  . Gabapentin Swelling  . Codeine Rash   Consultations:  Neurology  Neurosurgery  Procedures/Studies: Ct Angio Head W Or Wo Contrast  Result Date: 06/14/2017 CLINICAL DATA:  Follow-up stroke and cerebral artery occlusion. EXAM: CT ANGIOGRAPHY HEAD AND NECK TECHNIQUE: Multidetector CT imaging of the head and neck was performed using the standard protocol during bolus administration of intravenous contrast. Multiplanar CT image reconstructions and MIPs were obtained to evaluate the vascular anatomy. Carotid stenosis measurements (when applicable) are obtained utilizing NASCET criteria, using the distal internal carotid diameter as the denominator. CONTRAST:  50 cc Isovue 370 COMPARISON:  MRI and MRA head June 13, 2017 FINDINGS: CTA NECK  AORTIC ARCH: Normal appearance of the thoracic arch, 2 vessel arch is a normal variant. Mild calcific atherosclerosis aortic arch. Intimal thickening resulting in moderate stenosis RIGHT subclavian artery origin. RIGHT CAROTID SYSTEM: Common carotid artery is widely patent, retropharyngeal course. Mild  eccentric calcific atherosclerosis RIGHT carotid bifurcation without hemodynamically significant stenosis by NASCET criteria. RIGHT tonsillar loop. LEFT CAROTID SYSTEM: Common carotid artery is widely patent, coursing in a straight line fashion. Retropharyngeal course. Mild eccentric intimal thickening without hemodynamically significant stenosis by NASCET criteria. VERTEBRAL ARTERIES:Left vertebral artery is dominant. Patent bilateral vertebral artery's, mild extrinsic deformity due to degenerative cervical spine. SKELETON: No acute osseous process though bone windows have not been submitted. OTHER NECK: Soft tissues of the neck are nonacute though, not tailored for evaluation. Reversed cervical lordosis with severe C5-6 and C6-7 degenerative discs, moderate at C4-5. Severe canal stenosis C5-6. Moderate to severe C5-6 and C6-7 neural foraminal narrowing. UPPER CHEST: Included lung apices are clear. No superior mediastinal lymphadenopathy. Mild suspected cardiomegaly with hazy ground-glass opacities and pulmonary vascular congestion. CTA HEAD ANTERIOR CIRCULATION: Calcific atherosclerosis resulting in severe stenosis RIGHT anterior genu of the internal carotid artery to paraophthalmic segment. 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 Moderate stenosis LEFT supraclinoid internal carotid artery. Thready RIGHT A1 segment with moderate luminal irregularity LEFT A1 segment. Patent anterior communicating artery. Bilateral anterior cerebral artery's are patent. Occluded LEFT M2 origin, superior division. Thready inferior division with poor collateralization in pruning. Moderate stenosis RIGHT M1 segment. Severe stenosis proximal RIGHT M2 segment. No large vessel occlusion, significant stenosis, contrast extravasation or aneurysm. POSTERIOR CIRCULATION: Patent vertebral arteries, vertebrobasilar junction and basilar artery, as well as main branch vessels. Occluded RIGHT proximal P2 segment  with mild collateralization. Severe stenosis LEFT P3 origin. No large vessel occlusion, significant stenosis, contrast extravasation or aneurysm. VENOUS SINUSES: Major dural venous sinuses are patent though not tailored for evaluation on this angiographic examination. ANATOMIC VARIANTS: None. DELAYED PHASE: No abnormal intracranial enhancement. MIP images reviewed. IMPRESSION: CTA NECK: 1. Atherosclerosis without hemodynamically significant stenosis or acute vascular process in the neck. 2. Suspected cardiomegaly and pulmonary edema. Recommend chest radiograph. 3. Degenerative cervical spine resulting in severe canal stenosis C5-6. Moderate to severe C5-6 and C6-7 neural foraminal narrowing. CTA HEAD: 1. Severe stenosis RIGHT internal carotid artery at anterior genu/supraclinoid segment due to advanced atherosclerosis. 2. 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 3. Occluded RIGHT PCA at proximal P2 segment, poor collateralization and, likely acute given recent MRI findings. 4. Severe stenosis RIGHT M2 and LEFT P3 segments. Moderate stenosis RIGHT M1 segment. 5. 2 mm RIGHT PCOM origin aneurysm. Aortic Atherosclerosis (ICD10-I70.0). Electronically Signed   By: Elon Alas M.D.   On: 06/14/2017 02:28   Ct Head Wo Contrast  Result Date: 06/13/2017 CLINICAL DATA:  Bilateral lower extremity weakness for 2 days. History of stroke with residual right-sided weakness. EXAM: CT HEAD WITHOUT CONTRAST TECHNIQUE: Contiguous axial images were obtained from the base of the skull through the vertex without intravenous contrast. COMPARISON:  None. FINDINGS: Brain: There is a moderate-sized chronic left MCA infarct in the parietal lobe. A chronic lateral lenticulostriate infarct is noted involving the anterior limb of the left internal capsule and basal ganglia. There is slight ex vacuo enlargement of the left lateral ventricle. There is no evidence of acute infarct, intracranial hemorrhage, mass, midline shift, or  extra-axial fluid collection. Vascular: Calcified atherosclerosis at the skullbase. No hyperdense vessel. Skull: No fracture or focal osseous lesion. Sinuses/Orbits: Unremarkable orbits.  Partially visualized small osteoma in the left maxillary sinus. Clear mastoid air cells. Other: None. IMPRESSION: 1. No evidence of acute intracranial abnormality. 2. Chronic left parietal and basal ganglia infarcts. Electronically Signed   By: Logan Bores M.D.   On: 06/13/2017 10:47   Ct Angio Neck W Or Wo Contrast  Result Date: 06/14/2017 CLINICAL DATA:  Follow-up stroke and cerebral artery occlusion. EXAM: CT ANGIOGRAPHY HEAD AND NECK TECHNIQUE: Multidetector CT imaging of the head and neck was performed using the standard protocol during bolus administration of intravenous contrast. Multiplanar CT image reconstructions and MIPs were obtained to evaluate the vascular anatomy. Carotid stenosis measurements (when applicable) are obtained utilizing NASCET criteria, using the distal internal carotid diameter as the denominator. CONTRAST:  50 cc Isovue 370 COMPARISON:  MRI and MRA head June 13, 2017 FINDINGS: CTA NECK AORTIC ARCH: Normal appearance of the thoracic arch, 2 vessel arch is a normal variant. Mild calcific atherosclerosis aortic arch. Intimal thickening resulting in moderate stenosis RIGHT subclavian artery origin. RIGHT CAROTID SYSTEM: Common carotid artery is widely patent, retropharyngeal course. Mild eccentric calcific atherosclerosis RIGHT carotid bifurcation without hemodynamically significant stenosis by NASCET criteria. RIGHT tonsillar loop. LEFT CAROTID SYSTEM: Common carotid artery is widely patent, coursing in a straight line fashion. Retropharyngeal course. Mild eccentric intimal thickening without hemodynamically significant stenosis by NASCET criteria. VERTEBRAL ARTERIES:Left vertebral artery is dominant. Patent bilateral vertebral artery's, mild extrinsic deformity due to degenerative cervical spine.  SKELETON: No acute osseous process though bone windows have not been submitted. OTHER NECK: Soft tissues of the neck are nonacute though, not tailored for evaluation. Reversed cervical lordosis with severe C5-6 and C6-7 degenerative discs, moderate at C4-5. Severe canal stenosis C5-6. Moderate to severe C5-6 and C6-7 neural foraminal narrowing. UPPER CHEST: Included lung apices are clear. No superior mediastinal lymphadenopathy. Mild suspected cardiomegaly with hazy ground-glass opacities and pulmonary vascular congestion. CTA HEAD ANTERIOR CIRCULATION: Calcific atherosclerosis resulting in severe stenosis RIGHT anterior genu of the internal carotid artery to paraophthalmic segment. 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 Moderate stenosis LEFT supraclinoid internal carotid artery. Thready RIGHT A1 segment with moderate luminal irregularity LEFT A1 segment. Patent anterior communicating artery. Bilateral anterior cerebral artery's are patent. Occluded LEFT M2 origin, superior division. Thready inferior division with poor collateralization in pruning. Moderate stenosis RIGHT M1 segment. Severe stenosis proximal RIGHT M2 segment. No large vessel occlusion, significant stenosis, contrast extravasation or aneurysm. POSTERIOR CIRCULATION: Patent vertebral arteries, vertebrobasilar junction and basilar artery, as well as main branch vessels. Occluded RIGHT proximal P2 segment with mild collateralization. Severe stenosis LEFT P3 origin. No large vessel occlusion, significant stenosis, contrast extravasation or aneurysm. VENOUS SINUSES: Major dural venous sinuses are patent though not tailored for evaluation on this angiographic examination. ANATOMIC VARIANTS: None. DELAYED PHASE: No abnormal intracranial enhancement. MIP images reviewed. IMPRESSION: CTA NECK: 1. Atherosclerosis without hemodynamically significant stenosis or acute vascular process in the neck. 2. Suspected cardiomegaly  and pulmonary edema. Recommend chest radiograph. 3. Degenerative cervical spine resulting in severe canal stenosis C5-6. Moderate to severe C5-6 and C6-7 neural foraminal narrowing. CTA HEAD: 1. Severe stenosis RIGHT internal carotid artery at anterior genu/supraclinoid segment due to advanced atherosclerosis. 2. 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 3. Occluded RIGHT PCA at proximal P2 segment, poor collateralization and, likely acute given recent MRI findings. 4. Severe stenosis RIGHT M2 and LEFT P3 segments. Moderate stenosis RIGHT M1 segment. 5. 2 mm RIGHT PCOM origin aneurysm. Aortic Atherosclerosis (ICD10-I70.0). Electronically Signed  By: Elon Alas M.D.   On: 06/14/2017 02:28   Mr Brain Wo Contrast (neuro Protocol)  Result Date: 06/13/2017 CLINICAL DATA:  Initial evaluation for acute left leg weakness. EXAM: MRI HEAD WITHOUT CONTRAST TECHNIQUE: Multiplanar, multiecho pulse sequences of the brain and surrounding structures were obtained without intravenous contrast. COMPARISON:  Prior CT from earlier the same day. FINDINGS: Brain: Generalized age appropriate cerebral atrophy. Encephalomalacia with gliosis within the posterior left frontoparietal region compatible with remote left MCA territory infarct. Additional remote lacunar type infarcts present within the left basal ganglia. Approximate 1 cm focus of restricted diffusion at the lateral right midbrain at the level the right cortical spinal tract (series 3, image 24), consistent with an acute ischemic infarct. No associated mass effect or hemorrhage. No other evidence for acute or subacute ischemia. No other areas of chronic infarction. No other acute or chronic intracranial hemorrhage. No mass lesion, midline shift or mass effect. No hydrocephalus. No extra-axial fluid collection. Major dural sinuses are grossly patent. Pituitary gland suprasellar region within normal limits. Vascular: Major intracranial vascular flow voids  maintained. Skull and upper cervical spine: Craniocervical junction normal. Mild degenerate spondylolysis noted within the upper cervical spine without significant stenosis. Bone marrow signal intensity within normal limits. No scalp soft tissue abnormality. Sinuses/Orbits: Globes and orbital soft tissues within normal limits. Mild scattered mucosal thickening within the ethmoidal air cells and maxillary sinuses. Paranasal sinuses are otherwise clear. No mastoid effusion. Inner ear structures normal. IMPRESSION: 1. 1 cm acute ischemic nonhemorrhagic infarct involving the lateral right midbrain at the level the right corticospinal tract. No associated mass effect. 2. Remote posterior left MCA territory and left basal ganglia infarcts as above. Electronically Signed   By: Jeannine Boga M.D.   On: 06/13/2017 18:15   Mr Lumbar Spine Wo Contrast  Result Date: 06/13/2017 CLINICAL DATA:  Initial evaluation for acute back pain, leg weakness. EXAM: MRI LUMBAR SPINE WITHOUT CONTRAST TECHNIQUE: Multiplanar, multisequence MR imaging of the lumbar spine was performed. No intravenous contrast was administered. COMPARISON:  None. FINDINGS: Segmentation: Normal segmentation. Lowest well-formed disc labeled the L5-S1 level. Alignment: 4 mm anterolisthesis of L4 on L5. Trace retrolisthesis of L2 on L3 and L3 on L4. Vertebral bodies otherwise normally aligned with preservation of the normal lumbar lordosis. Vertebrae: Vertebral body heights maintained. No evidence for acute or chronic fracture. Signal intensity within the vertebral body bone marrow within normal limits. No discrete or worrisome osseous lesions. No abnormal marrow edema. Conus medullaris: Extends to the L1 level and appears normal. Paraspinal and other soft tissues: Paraspinous soft tissues demonstrate no acute abnormality. Fibroid uterus noted. Partially visualized visceral structures otherwise unremarkable. Disc levels: Mild diffuse congenital shortening  of the pedicles noted. L1-2:  Unremarkable. L2-3: Diffuse degenerative disc bulge with disc desiccation. Superimposed left foraminal/ extraforaminal disc protrusion, contacting the exiting left L2 nerve root as it courses of the left neural foramen (series 4, image 11). No significant canal stenosis. Mild bilateral L2 foraminal stenosis related to disc bulge and short pedicles. L3-4: Diffuse disc bulge with disc desiccation and mild intervertebral disc space narrowing. Mild facet and ligamentum flavum hypertrophy. Short pedicles. Resultant moderate canal and bilateral subarticular stenosis. Mild to moderate bilateral L3 foraminal narrowing. L4-5: 4 mm anterolisthesis of L4 on L5. Associated diffuse disc bulge. Moderate bilateral facet arthrosis with ligamentum flavum hypertrophy, slightly worse on the right. Short pedicles. Severe canal and bilateral subarticular stenosis. Moderate bilateral L4 foraminal narrowing, right worse than left. L5-S1: Diffuse disc bulge  with disc desiccation and intervertebral disc space narrowing. Superimposed left subarticular disc protrusion encroaches upon the left lateral recess, impinging upon the descending left S1 nerve root (series 5, image 25). Mild bilateral facet hypertrophy. Relatively mild canal narrowing. Mild left L5 foraminal stenosis present as well. IMPRESSION: 1. 4 mm anterolisthesis of L4 on L5 with associated disc bulge and moderate facet arthropathy, resulting in severe canal and bilateral subarticular stenosis, with moderate bilateral L4 foraminal narrowing. 2. Left subarticular disc protrusion at L5-S1, impinging upon the descending left S1 nerve root in the left lateral recess. 3. Left foraminal/extraforaminal disc protrusion at L2-3, contacting and potentially irritating the exiting left L2 nerve root. 4. Diffuse congenital shortening of pedicles. 5. Fibroid uterus. Electronically Signed   By: Jeannine Boga M.D.   On: 06/13/2017 18:41   Dg Chest Port 1  View  Result Date: 06/14/2017 CLINICAL DATA:  Acute ischemic stroke.  Hypertension. EXAM: PORTABLE CHEST 1 VIEW COMPARISON:  None. FINDINGS: The heart size and mediastinal contours are within normal limits. Both lungs are clear. The visualized skeletal structures are unremarkable. IMPRESSION: No active disease. Electronically Signed   By: Earle Gell M.D.   On: 06/14/2017 07:43   Mr Jodene Nam Head Wo Contrast  Result Date: 06/13/2017 CLINICAL DATA:  Follow-up acute midbrain infarct. Recent unsteady gait. History of strokes. EXAM: MRA HEAD WITHOUT CONTRAST TECHNIQUE: Angiographic images of the Circle of Willis were obtained using MRA technique without intravenous contrast. COMPARISON:  MRI of the head June 13, 2017 at 1719 hours. FINDINGS: ANTERIOR CIRCULATION: Normal flow related enhancement of the included cervical, petrous, cavernous and supraclinoid internal carotid arteries. 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 Patent fenestrated anterior communicating artery. Patent anterior cerebral arteries, including distal segments. Occluded LEFT proximal M2 segment with paucity of mid to distal LEFT middle cerebral artery's. Severe stenosis proximal RIGHT M2 segment. Moderate luminal regularity proximal RIGHT middle cerebral artery's. POSTERIOR CIRCULATION: LEFT vertebral artery is dominant. Basilar artery is patent, with normal flow related enhancement of the main branch vessels. Occluded RIGHT proximal P2 segment with thready collaterals. Small bilateral posterior communicating artery is present. Severe stenosis proximal LEFT P3 segment. No large vessel occlusion, high-grade stenosis,  aneurysm. ANATOMIC VARIANTS: None. Source images and MIP images were reviewed. IMPRESSION: 1. 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 2. Occluded LEFT middle cerebral artery at M2 origin  with poor collateralization. 3. Severe stenosis proximal RIGHT M2 segment and proximal LEFT P3 segment. 4. Focal irregularity and probable stenosis RIGHT supraclinoid internal carotid artery. 5. 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 6. Recommend CTA for further characterization. Acute findings discussed with and reconfirmed by Dr.TIMOTHY OPYD on 06/13/2017 at 11:50 pm. Electronically Signed   By: Elon Alas M.D.   On: 06/13/2017 23:50   ECHOCARDIOGRAM - Left ventricle: The cavity size was normal. Wall thickness was increased in a pattern of mild LVH. Systolic function wasvigorous. The estimated ejection fraction was in the range of 65%to 70%.  Subjective: Seen and examined at bedside and was doing better. No nausea or vomiting. Ready to go get Rehab.  Discharge Exam: Vitals:   06/17/17 0605 06/17/17 0939  BP: 132/73 128/83  Pulse: 68 78  Resp: 20 20  Temp: 98.2 F (36.8 C) 98.7 F (37.1 C)  SpO2: 97% 97%   Vitals:   06/16/17 2019 06/17/17 0125 06/17/17 0605 06/17/17 0939  BP: (!) 151/93 116/90  132/73 128/83  Pulse: 100 (!) 110 68 78  Resp: 18 16 20 20   Temp: 98.6 F (37 C) 98.1 F (36.7 C) 98.2 F (36.8 C) 98.7 F (37.1 C)  TempSrc: Oral Oral Oral Oral  SpO2: 98% 95% 97% 97%  Weight:      Height:       General: Pt is alert, awake, not in acute distress Cardiovascular: RRR, S1/S2 +, no rubs, no gallops Respiratory: CTA bilaterally, no wheezing, no rhonchi Abdominal: Soft, NT, ND, bowel sounds + Extremities: no edema, no cyanosis; Had Lower Extremity Weakness  The results of significant diagnostics from this hospitalization (including imaging, microbiology, ancillary and laboratory) are listed below for reference.    Microbiology: No results found for this or any previous visit (from the past 240 hour(s)).   Labs: BNP (last 3 results) No results for input(s): BNP in the last 8760 hours. Basic Metabolic Panel:  Recent Labs Lab  06/13/17 1144 06/14/17 0913 06/15/17 0315  NA 139 141 140  K 4.0 3.6 3.7  CL 106 110 109  CO2 23 23 23   GLUCOSE 101* 101* 99  BUN 9 9 12   CREATININE 0.62 0.56 0.66  CALCIUM 10.2 9.2 9.3  MG  --  1.7 1.8  PHOS  --  3.9 4.2   Liver Function Tests:  Recent Labs Lab 06/13/17 1144 06/14/17 0913 06/15/17 0315  AST 31 26 27   ALT 38 32 29  ALKPHOS 61 52 49  BILITOT 0.4 0.5 0.4  PROT 7.6 6.8 6.4*  ALBUMIN 3.9 3.4* 3.5   No results for input(s): LIPASE, AMYLASE in the last 168 hours. No results for input(s): AMMONIA in the last 168 hours. CBC:  Recent Labs Lab 06/13/17 1144 06/14/17 0913 06/15/17 0315  WBC 12.2* 12.1* 9.9  NEUTROABS 8.2* 8.0* 5.3  HGB 13.0 12.1 12.3  HCT 40.7 37.8 38.6  MCV 90.0 89.8 89.4  PLT 303 280 257   Cardiac Enzymes: No results for input(s): CKTOTAL, CKMB, CKMBINDEX, TROPONINI in the last 168 hours. BNP: Invalid input(s): POCBNP CBG:  Recent Labs Lab 06/16/17 1225  GLUCAP 146*   D-Dimer No results for input(s): DDIMER in the last 72 hours. Hgb A1c No results for input(s): HGBA1C in the last 72 hours. Lipid Profile No results for input(s): CHOL, HDL, LDLCALC, TRIG, CHOLHDL, LDLDIRECT in the last 72 hours. Thyroid function studies No results for input(s): TSH, T4TOTAL, T3FREE, THYROIDAB in the last 72 hours.  Invalid input(s): FREET3 Anemia work up No results for input(s): VITAMINB12, FOLATE, FERRITIN, TIBC, IRON, RETICCTPCT in the last 72 hours. Urinalysis    Component Value Date/Time   COLORURINE STRAW (A) 06/13/2017 1116   APPEARANCEUR CLEAR 06/13/2017 1116   LABSPEC 1.008 06/13/2017 1116   PHURINE 5.0 06/13/2017 1116   GLUCOSEU NEGATIVE 06/13/2017 1116   HGBUR MODERATE (A) 06/13/2017 1116   BILIRUBINUR NEGATIVE 06/13/2017 [place] 06/13/2017 1116   PROTEINUR NEGATIVE 06/13/2017 1116   NITRITE NEGATIVE 06/13/2017 1116   LEUKOCYTESUR NEGATIVE 06/13/2017 1116   Sepsis Labs Invalid input(s):  PROCALCITONIN,  WBC,  LACTICIDVEN Microbiology No results found for this or any previous visit (from the past 240 hour(s)).  Time coordinating discharge: 35 minutes  SIGNED:  Kerney Elbe, DO [facility] 06/17/2017, 1:02 PM Pager [phone]  If 7PM-7AM, please contact night-coverage [email or web address] Password [email or web address]

## 2017-06-17 NOTE — PMR Pre-admission (Signed)
PMR Admission Coordinator Pre-Admission Assessment  Patient: Leslie Simmons is an 51 y.o., female MRN: [id number] DOB: 17-Sep-1966 Height: 5' (152.4 cm) Weight: 80.5 kg (177 lb 7.5 oz)              Insurance Information HMO:     PPO:      PCP:      IPA:      80/20: yes     OTHER:  No HMO PRIMARY: Medicare a and n      Policy#: [id number]      Subscriber: pt Benefits:  Phone #: passport one online listed as Leslie Simmons     Name: 06/17/2017 Eff. Date: 02/12/2014     Deduct: $1340      Out of Pocket Max: none      Life Max: none CIR: 100%      SNF: 20 full days Outpatient: 80%     Co-Pay: 20% Home Health: 100%      Co-Pay: none DME: 80%     Co-Pay: 20% Providers: pt choice  SECONDARY: [place] (Medical)      Policy#: [id number]      Subscriber: pt  Medicaid Application Date:       Case Manager:  Disability Application Date:       Case Worker:  Pt states her secondary is [place]. She is moving permanently to [place] with boyfriend, and he is needing assistance or direction on how to establish her residency in [place] and to apply for [facility] medicaid  Emergency Contact Information Contact Information    Name Relation Home Work Mobile   Leslie Simmons,Leslie Simmons Significant other [phone]       Current Medical History  Patient Admitting Diagnosis: right midbrain/corticospinal infarct, lumbosacral spondylosis with radiculopathy  History of Present Illness:  HPI: Leslie Simmons a 51 y.o.femalewith history of HTN, hemorrhagic stroke with residual right sided weakness/numbness and seizure who was admitted on 06/13/17 with reports of back pain and LLE weakness since the night before. MRI brain done revealing acute nonhemorrhagic infarct involving lateral right midbrain at right corticospinal tract and Remote L-MCA and basal ganglia infarcts.MRI lumbar spine done revealing L4 on L5 anterolisthesis with severe canal and bilateral subarticular stenosis and left subarticular Disc protrusion  L5-S1 with impingement of S1 nerve. Work up ongoing and NS consulted for input on stenosis. Dr. Ronnald Ramp recommended therapy and elected decompression in the future. Therapy evaluations initiated revealing deficits in gait, ability to ADLs, lability and mild dysarthria with mild deficits in working memory. CIR recommended by rehab team  Total: 1 NIHSS    Past Medical History  Past Medical History:  Diagnosis Date  . Hypertension   . Stroke [facility])     Family History  family history includes Stroke in her son.  Prior Rehab/Hospitalizations:  Has the patient had major surgery during 100 days prior to admission? No  Current Medications   Current Facility-Administered Medications:  .  acetaminophen (TYLENOL) tablet 650 mg, 650 mg, Oral, Q4H PRN **OR** [DISCONTINUED] acetaminophen (TYLENOL) solution 650 mg, 650 mg, Per Tube, Q4H PRN **OR** [DISCONTINUED] acetaminophen (TYLENOL) suppository 650 mg, 650 mg, Rectal, Q4H PRN, Opyd, Ilene Qua, MD .  aspirin EC tablet 325 mg, 325 mg, Oral, Daily, Rosalin Hawking, MD, 325 mg at 06/17/17 0841 .  atorvastatin (LIPITOR) tablet 80 mg, 80 mg, Oral, q1800, Opyd, Ilene Qua, MD, 80 mg at 06/16/17 1745 .  clopidogrel (PLAVIX) tablet 75 mg, 75 mg, Oral, Daily, Aroor, Lanice Schwab, MD, 75 mg  at 06/17/17 0842 .  diphenhydrAMINE (BENADRYL) capsule 25 mg, 25 mg, Oral, Q6H PRN, Raiford Noble Latif, DO, 25 mg at 06/16/17 1745 .  docusate sodium (COLACE) capsule 100 mg, 100 mg, Oral, Daily, Opyd, Ilene Qua, MD, 100 mg at 06/17/17 0841 .  ferrous sulfate tablet 325 mg, 325 mg, Oral, Q breakfast, Opyd, Ilene Qua, MD, 325 mg at 06/17/17 0841 .  HYDROcodone-acetaminophen (NORCO/VICODIN) 5-325 MG per tablet 1-2 tablet, 1-2 tablet, Oral, Q6H PRN, Opyd, Ilene Qua, MD, 2 tablet at 06/17/17 0842 .  labetalol (NORMODYNE,TRANDATE) injection 5 mg, 5 mg, Intravenous, Q2H PRN, Opyd, Timothy S, MD .  lamoTRIgine (LAMICTAL) tablet 200 mg, 200 mg, Oral, Daily, Opyd, Ilene Qua, MD, 200 mg at  06/17/17 0843 .  polyethylene glycol (MIRALAX / GLYCOLAX) packet 17 g, 17 g, Oral, BID, Raiford Noble Huttig, DO, 17 g at 06/17/17 4765 .  senna-docusate (Senokot-S) tablet 1 tablet, 1 tablet, Oral, QHS PRN, Opyd, Ilene Qua, MD, 1 tablet at 06/16/17 0917 .  vitamin B-12 (CYANOCOBALAMIN) tablet 1,000 mcg, 1,000 mcg, Oral, Daily, Opyd, Ilene Qua, MD, 1,000 mcg at 06/17/17 0842 .  zolpidem (AMBIEN) tablet 5 mg, 5 mg, Oral, QHS, Opyd, Ilene Qua, MD, 5 mg at 06/16/17 2304  Patients Current Diet: Diet Heart Room service appropriate? Yes; Fluid consistency: Thin  Precautions / Restrictions Precautions Precautions: Fall Precaution Comments: ck BP and pulses Restrictions Weight Bearing Restrictions: No   Has the patient had 2 or more falls or a fall with injury in the past year?No  Prior Activity Level Community (5-7x/wk): Independent without AD; does not drive  Development worker, international aid / [place] Devices/Equipment: None Home Equipment: Walker - 4 wheels  Prior Device Use: Indicate devices/aids used by the patient prior to current illness, exacerbation or injury? None of the above  Prior Functional Level Prior Function Level of Independence: Independent Comments: daughter did housekeeping and cooking.   Self Care: Did the patient need help bathing, dressing, using the toilet or eating?  Independent  Indoor Mobility: Did the patient need assistance with walking from room to room (with or without device)? Independent  Stairs: Did the patient need assistance with internal or external stairs (with or without device)? Independent  Functional Cognition: Did the patient need help planning regular tasks such as shopping or remembering to take medications? Needed some help  Current Functional Level Cognition  Arousal/Alertness: Awake/alert Overall Cognitive Status: Within Functional Limits for tasks assessed Orientation Level: Oriented X4 General Comments: Pt follows  instructions with extra time Attention: Sustained Sustained Attention: Appears intact Memory: Impaired Memory Impairment: Retrieval deficit Awareness: Appears intact Safety/Judgment: Appears intact    Extremity Assessment (includes Sensation/Coordination)  Upper Extremity Assessment: Defer to OT evaluation RUE Deficits / Details: residual weakness and incoordination form previous CVa. Uses as functional assist RUE Sensation: decreased light touch, decreased proprioception RUE Coordination: decreased fine motor LUE Deficits / Details: LUE generalized weakness. Stronger proximally. Able to complete full ROM with isolated movements out of synergy. Difficulty with coordinated movements adn in hand manipulation skills. Hand slipping off chair when pushing up. Able to maintain grasp on RW. LUE Sensation: decreased light touch LUE Coordination: decreased fine motor  Lower Extremity Assessment: LLE deficits/detail LLE Deficits / Details: Strength in L knee and ankle were low, 3- quads and 2 DF, hip 2+ and has difficulty using LLE to take a step LLE Sensation: decreased light touch LLE Coordination: decreased fine motor, decreased gross motor    ADLs  Overall ADL's :  Needs assistance/impaired Eating/Feeding: Sitting, Minimal assistance Eating/Feeding Details (indicate cue type and reason): Issued built up handles. Pt performed self feed using built up handle on spoon to scoop apple sauce. Pt demonstrating increased fucntional performance and stabilizes apple sauce contianer with L hand while self feeding with R.  Min A provided to open apple sauce container Grooming: Minimal assistance Grooming Details (indicate cue type and reason): Discussed that built up handle may be used on grooming utensils.  Upper Body Bathing: Minimal assistance, Sitting Lower Body Bathing: Moderate assistance, Sit to/from stand Upper Body Dressing : Moderate assistance, Sitting Lower Body Dressing: Moderate assistance,  Sit to/from stand Toilet Transfer: RW, Moderate assistance (LLE buckling at times) Toileting- Clothing Manipulation and Hygiene: Moderate assistance, Sit to/from stand Functional mobility during ADLs: Moderate assistance, Rolling walker, Cueing for safety General ADL Comments: Focused on FM skills, targeted grasp, and using built up handle for self feeding, grooming, and writing.    Mobility  Overal bed mobility: Needs Assistance Bed Mobility: Supine to Sit, Sit to Supine Supine to sit: Min assist, Mod assist (min to lift trunk then mod to finish scooting to EOB) Sit to supine: Mod assist, Max assist Sit to sidelying: Mod assist General bed mobility comments: up in the chair    Transfers  Overall transfer level: Needs assistance Equipment used: Rolling walker (2 wheeled), 1 person hand held assist Transfers: Sit to/from Stand Sit to Stand: Min assist, Min guard Stand pivot transfers: Mod assist General transfer comment: worked on sit to stand to sit technique especially coming forward to stand and sit effortlessly and with control    Ambulation / Gait / Stairs / Wheelchair Mobility  Ambulation/Gait Ambulation/Gait assistance: Museum/gallery curator (Feet): 100 Feet Assistive device: Rolling walker (2 wheeled) Gait Pattern/deviations: Step-to pattern, Step-through pattern, Decreased step length - right, Decreased stance time - left, Decreased step length - left, Decreased stride length General Gait Details: Transition from step to to step through with concentration on heel toe pattern and some pelvic translation .  As muscles fatigued, trunk lost control, needing stability support. Gait velocity: reduced Gait velocity interpretation: at or above normal speed for age/gender    Posture / Balance Balance Overall balance assessment: Needs assistance Sitting-balance support: No upper extremity supported Sitting balance-Leahy Scale: Fair Standing balance-Leahy Scale:  Poor Standing balance comment: heavy reliance on RW    Special needs/care consideration BiPAP/CPAP  N/a CPM  N/a Continuous Drip IV  N/a Dialysis  N/a Life Vest  N/a Oxygen  N/a Special Bed  N/a Trach Size n/a Wound Vac n/a Skin  Intact  Bowel mgmt: continent LBM 06/16/17 Bladder mgmt: continent Diabetic mgmt Hgb A1c 5.9   Previous Home Environment Living Arrangements: Spouse/significant other (moved here a month ago to be with her boyfriend, Juanda Crumble)  Lives With: Family (lived with daughter and her spouse in [place], they have now tran) Available Help at Discharge:  Juanda Crumble works 4 am until 230 pm daily) Type of Home: Apartment (apartment in [place] with daughter) Home Layout: Other (Comment) Home Access: Level entry (per pt) Entrance Stairs-Number of Steps: 2 Bathroom Shower/Tub: Tub/shower unit, Multimedia programmer: Associate Professor Accessibility: Yes How Accessible: Accessible via walker Home Care Services: No Additional Comments: pt lived in apartment with daughter in [place] was in [place] with Juanda Crumble for a month and plans to move in with him permanently. His address is [address], [place]. Daughter who she was living with in [place] has since moved to [place]  with her spouse as they are Nature conservation officer and now stationed there  Discharge Living Setting Plans for Discharge Living Setting: House (is moving in with Juanda Crumble, Boyfriend) Type of Home at Discharge: House Discharge Home Layout: One level Discharge Home Access:  (one step) Discharge Bathroom Shower/Tub: Tub/shower unit, Curtain, Walk-in shower (has both) Discharge Bathroom Toilet: Standard Discharge Bathroom Accessibility: Yes How Accessible: Accessible via walker Does the patient have any problems obtaining your medications?:  (does not have PCP in [place]; moving here permanently)  Social/Family/Support Systems Patient Roles: Parent, Partner Contact Information: Juanda Crumble, boyfriend and daughter,  [place] Anticipated Caregiver: Leslie Simmons Anticipated Ambulance person Information: see above Ability/Limitations of Caregiver: works daily Caregiver Availability: Evenings only Discharge Plan Discussed with Primary Caregiver: Yes Is Caregiver In Agreement with Plan?: Yes Does Caregiver/Family have Issues with Lodging/Transportation while Pt is in Rehab?: No  Goals/Additional Needs Patient/Family Goal for Rehab: Mod I with PT, OT, and SLP Expected length of stay: ELOS 13- 19 days Special Service Needs: Needs her Medicaid of [place] siched to [place] in [place] and Medicaid [place] Pt/Family Agrees to Admission and willing to participate: Yes Program Orientation Provided & Reviewed with Pt/Caregiver Including Roles  & Responsibilities: Yes  Barriers to Discharge: Decreased caregiver support, Pending surgery (lumbar fusion after recovery from PCP)  Decrease burden of Care through IP rehab admission: n/a   Possible need for SNF placement upon discharge: not anticipated  Patient Condition: This patient's medical and functional status has changed since the consult dated: 06/14/2017 in which the Rehabilitation Physician determined and documented that the patient's condition is appropriate for intensive rehabilitative care in an inpatient rehabilitation facility. See "History of Present Illness" (above) for medical update. Functional changes are: overall min to mod assist. Patient's medical and functional status update has been discussed with the Rehabilitation physician and patient remains appropriate for inpatient rehabilitation. Will admit to inpatient rehab today.  Preadmission Screen Completed By:  Cleatrice Burke, 06/17/2017 11:12 AM ______________________________________________________________________   Discussed status with Dr. Naaman Plummer on 06/17/2017 at  1111 and received telephone approval for admission today.  Admission Coordinator:  Cleatrice Burke, time 2248 Date 06/17/2017

## 2017-06-17 NOTE — Progress Notes (Signed)
Patient taken by wheelchair to [phone] with all belongings.  Able to transfer self from wheelchair to bed.

## 2017-06-17 NOTE — Care Management Note (Signed)
Case Management Note  Patient Details  Name: Lisa Milian MRN: [id number] Date of Birth: April 25, 1966  Subjective/Objective:                    Action/Plan: Pt discharging to CIR today. No further needs per CM.   Expected Discharge Date:                  Expected Discharge Plan:  [place]  In-House Referral:     Discharge planning Services  CM Consult  Post Acute Care Choice:    Choice offered to:     DME Arranged:    DME Agency:     HH Arranged:    [place] Agency:     Status of Service:  Completed, signed off  If discussed at [employer] of Stay Meetings, dates discussed:    Additional Comments:  Pollie Friar, RN 06/17/2017, 11:25 AM

## 2017-06-17 NOTE — Progress Notes (Signed)
Meredith Staggers, MD Physician Signed Physical Medicine and Rehabilitation  Consult Note Date of Service: 06/14/2017 3:05 PM  Related encounter: ED to Hosp-Admission (Discharged) from 06/13/2017 in [facility] Collapse All   [] Hide copied text [] Hover for attribution information      Physical Medicine and Rehabilitation Consult   Reason for Consult: Stroke with deficits in mobility/ADLs Referring Physician: Dr. Alfredia Ferguson.    HPI: Leslie Simmons is a 51 y.o. female with history fo HTN, hemorrhagic stroke with residual right sided weakness and seizure who was admitted on 06/13/17 with reports of back pain and LLE weakness since the night before. MRI brain done revealing acute nonhemorrhagic infarct involving lateral right midbrain at right corticospinal tract and  Remote L-MCA and basal ganglia infarcts. MRI lumbar spine done revealing L4 onL5 anterolisthesis with severe canal and bilateral subarticular stenosis and left subarticular  Disc protrusion L5-S1 with impingement of S1 nerve. Work up ongoing and NS consulted for input on stenosis. Dr.  Ronnald Ramp recommended therapy and elected decompression in the future. Therapy evaluations initiated revealing deficits in gait, ability to ADLs, lability and mild dysarthria with mild deficits in working memory. CIR recommended by rehab team.    ROS        Past Medical History:  Diagnosis Date  . Hypertension   . Stroke [facility])     History reviewed. No pertinent surgical history.         Family History  Problem Relation Age of Onset  . Stroke Son     Social History:  Lives with sister in [place]. Currently visiting boyfriend in [place]. reports that she has never smoked. She has never used smokeless tobacco. She reports that she does not drink alcohol or use drugs.        Allergies  Allergen Reactions  . Gabapentin Swelling  . Codeine Rash          Medications Prior to  Admission  Medication Sig Dispense Refill  . aspirin EC 81 MG tablet Take 81 mg by mouth daily.    Marland Kitchen atorvastatin (LIPITOR) 10 MG tablet Take 10 mg by mouth at bedtime.    . Cyanocobalamin (VITAMIN B-12) 1000 MCG SUBL Place 1,000 mcg under the tongue daily.    Marland Kitchen docusate sodium (COLACE) 100 MG capsule Take 100 mg by mouth daily.    . ferrous sulfate 325 (65 FE) MG tablet Take 325 mg by mouth daily.    . hydrochlorothiazide (HYDRODIURIL) 25 MG tablet Take 25 mg by mouth every morning.    Marland Kitchen ibuprofen (ADVIL,MOTRIN) 600 MG tablet Take 600 mg by mouth every 8 (eight) hours as needed for pain.    Marland Kitchen lamoTRIgine (LAMICTAL) 100 MG tablet Take 200 mg by mouth daily.    . metoprolol tartrate (LOPRESSOR) 100 MG tablet Take 100 mg by mouth 2 (two) times daily.    . valsartan (DIOVAN) 320 MG tablet Take 320 mg by mouth daily.    Marland Kitchen zolpidem (AMBIEN) 10 MG tablet Take 10 mg by mouth at bedtime.      Home: Home Living Family/patient expects to be discharged to:: Private residence Living Arrangements: Spouse/significant other, Children Available Help at Discharge: Family, Available PRN/intermittently Type of Home: House Home Access: Stairs to enter [employer] of Steps: 2 [place]: One level Bathroom Shower/Tub: Tub/shower unit, Multimedia programmer: Standard Bathroom Accessibility: Yes Home Equipment: None (Pt has RW in [place])  Lives With: Family (  plans to live with significant other in [place])  Functional History: Prior Function Level of Independence: Independent Functional Status:  Mobility: Bed Mobility Overal bed mobility: Needs Assistance Bed Mobility: Sit to Sidelying Sit to sidelying: Mod assist General bed mobility comments: to lift BLE onto bed Transfers Overall transfer level: Needs assistance Transfers: Sit to/from Stand, Stand Pivot Transfers Sit to Stand: Min assist Stand pivot transfers: Mod  assist  ADL: ADL Overall ADL's : Needs assistance/impaired Eating/Feeding: Minimal assistance Eating/Feeding Details (indicate cue type and reason): will further assess need for AE Grooming: Minimal assistance Upper Body Bathing: Minimal assistance, Sitting Lower Body Bathing: Moderate assistance, Sit to/from stand Upper Body Dressing : Moderate assistance, Sitting Lower Body Dressing: Moderate assistance, Sit to/from stand Toilet Transfer: RW, Moderate assistance (LLE buckling at times) Toileting- Clothing Manipulation and Hygiene: Moderate assistance, Sit to/from stand Functional mobility during ADLs: Moderate assistance, Rolling walker, Cueing for safety  Cognition: Cognition Overall Cognitive Status: Within Functional Limits for tasks assessed Arousal/Alertness: Awake/alert Orientation Level: Oriented X4 Attention: Sustained Sustained Attention: Appears intact Memory: Impaired Memory Impairment: Retrieval deficit Awareness: Appears intact Safety/Judgment: Appears intact Cognition Arousal/Alertness: Awake/alert Behavior During Therapy: Flat affect (tearful) Overall Cognitive Status: Within Functional Limits for tasks assessed General Comments: pt withslow processing; most likely baseline, will further assess  Blood pressure 138/88, pulse 95, temperature 98.8 F (37.1 C), temperature source Oral, resp. rate 20, height 5' (1.524 m), weight 80.5 kg (177 lb 7.5 oz), SpO2 98 %. Physical Exam  Lab Results Last 24 Hours       Results for orders placed or performed during the hospital encounter of 06/13/17 (from the past 24 hour(s))  Hemoglobin A1c     Status: Abnormal   Collection Time: 06/14/17  5:34 AM  Result Value Ref Range   Hgb A1c MFr Bld 5.9 (H) 4.8 - 5.6 %   Mean Plasma Glucose 122.63 mg/dL  Lipid panel     Status: None   Collection Time: 06/14/17  5:34 AM  Result Value Ref Range   Cholesterol 141 0 - 200 mg/dL   Triglycerides 74 <150 mg/dL   HDL 43  >40 mg/dL   Total CHOL/HDL Ratio 3.3 RATIO   VLDL 15 0 - 40 mg/dL   LDL Cholesterol 83 0 - 99 mg/dL  CBC with Differential/Platelet     Status: Abnormal   Collection Time: 06/14/17  9:13 AM  Result Value Ref Range   WBC 12.1 (H) 4.0 - 10.5 K/uL   RBC 4.21 3.87 - 5.11 MIL/uL   Hemoglobin 12.1 12.0 - 15.0 g/dL   HCT 37.8 36.0 - 46.0 %   MCV 89.8 78.0 - 100.0 fL   MCH 28.7 26.0 - 34.0 pg   MCHC 32.0 30.0 - 36.0 g/dL   RDW 15.0 11.5 - 15.5 %   Platelets 280 150 - 400 K/uL   Neutrophils Relative % 67 %   Neutro Abs 8.0 (H) 1.7 - 7.7 K/uL   Lymphocytes Relative 27 %   Lymphs Abs 3.3 0.7 - 4.0 K/uL   Monocytes Relative 5 %   Monocytes Absolute 0.7 0.1 - 1.0 K/uL   Eosinophils Relative 1 %   Eosinophils Absolute 0.1 0.0 - 0.7 K/uL   Basophils Relative 0 %   Basophils Absolute 0.0 0.0 - 0.1 K/uL  Comprehensive metabolic panel     Status: Abnormal   Collection Time: 06/14/17  9:13 AM  Result Value Ref Range   Sodium 141 135 - 145 mmol/L   Potassium 3.6  3.5 - 5.1 mmol/L   Chloride 110 101 - 111 mmol/L   CO2 23 22 - 32 mmol/L   Glucose, Bld 101 (H) 65 - 99 mg/dL   BUN 9 6 - 20 mg/dL   Creatinine, Ser 0.56 0.44 - 1.00 mg/dL   Calcium 9.2 8.9 - 10.3 mg/dL   Total Protein 6.8 6.5 - 8.1 g/dL   Albumin 3.4 (L) 3.5 - 5.0 g/dL   AST 26 15 - 41 U/L   ALT 32 14 - 54 U/L   Alkaline Phosphatase 52 38 - 126 U/L   Total Bilirubin 0.5 0.3 - 1.2 mg/dL   GFR calc non Af Amer >60 >60 mL/min   GFR calc Af Amer >60 >60 mL/min   Anion gap 8 5 - 15  Magnesium     Status: None   Collection Time: 06/14/17  9:13 AM  Result Value Ref Range   Magnesium 1.7 1.7 - 2.4 mg/dL  Phosphorus     Status: None   Collection Time: 06/14/17  9:13 AM  Result Value Ref Range   Phosphorus 3.9 2.5 - 4.6 mg/dL      Imaging Results (Last 48 hours)  Ct Angio Head W Or Wo Contrast  Result Date: 06/14/2017 CLINICAL DATA:  Follow-up stroke and cerebral artery occlusion.  EXAM: CT ANGIOGRAPHY HEAD AND NECK TECHNIQUE: Multidetector CT imaging of the head and neck was performed using the standard protocol during bolus administration of intravenous contrast. Multiplanar CT image reconstructions and MIPs were obtained to evaluate the vascular anatomy. Carotid stenosis measurements (when applicable) are obtained utilizing NASCET criteria, using the distal internal carotid diameter as the denominator. CONTRAST:  50 cc Isovue 370 COMPARISON:  MRI and MRA head June 13, 2017 FINDINGS: CTA NECK AORTIC ARCH: Normal appearance of the thoracic arch, 2 vessel arch is a normal variant. Mild calcific atherosclerosis aortic arch. Intimal thickening resulting in moderate stenosis RIGHT subclavian artery origin. RIGHT CAROTID SYSTEM: Common carotid artery is widely patent, retropharyngeal course. Mild eccentric calcific atherosclerosis RIGHT carotid bifurcation without hemodynamically significant stenosis by NASCET criteria. RIGHT tonsillar loop. LEFT CAROTID SYSTEM: Common carotid artery is widely patent, coursing in a straight line fashion. Retropharyngeal course. Mild eccentric intimal thickening without hemodynamically significant stenosis by NASCET criteria. VERTEBRAL ARTERIES:Left vertebral artery is dominant. Patent bilateral vertebral artery's, mild extrinsic deformity due to degenerative cervical spine. SKELETON: No acute osseous process though bone windows have not been submitted. OTHER NECK: Soft tissues of the neck are nonacute though, not tailored for evaluation. Reversed cervical lordosis with severe C5-6 and C6-7 degenerative discs, moderate at C4-5. Severe canal stenosis C5-6. Moderate to severe C5-6 and C6-7 neural foraminal narrowing. UPPER CHEST: Included lung apices are clear. No superior mediastinal lymphadenopathy. Mild suspected cardiomegaly with hazy ground-glass opacities and pulmonary vascular congestion. CTA HEAD ANTERIOR CIRCULATION: Calcific atherosclerosis resulting in  severe stenosis RIGHT anterior genu of the internal carotid artery to paraophthalmic segment. 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 Moderate stenosis LEFT supraclinoid internal carotid artery. Thready RIGHT A1 segment with moderate luminal irregularity LEFT A1 segment. Patent anterior communicating artery. Bilateral anterior cerebral artery's are patent. Occluded LEFT M2 origin, superior division. Thready inferior division with poor collateralization in pruning. Moderate stenosis RIGHT M1 segment. Severe stenosis proximal RIGHT M2 segment. No large vessel occlusion, significant stenosis, contrast extravasation or aneurysm. POSTERIOR CIRCULATION: Patent vertebral arteries, vertebrobasilar junction and basilar artery, as well as main branch vessels. Occluded RIGHT proximal P2 segment with mild collateralization. Severe stenosis  LEFT P3 origin. No large vessel occlusion, significant stenosis, contrast extravasation or aneurysm. VENOUS SINUSES: Major dural venous sinuses are patent though not tailored for evaluation on this angiographic examination. ANATOMIC VARIANTS: None. DELAYED PHASE: No abnormal intracranial enhancement. MIP images reviewed. IMPRESSION: CTA NECK: 1. Atherosclerosis without hemodynamically significant stenosis or acute vascular process in the neck. 2. Suspected cardiomegaly and pulmonary edema. Recommend chest radiograph. 3. Degenerative cervical spine resulting in severe canal stenosis C5-6. Moderate to severe C5-6 and C6-7 neural foraminal narrowing. CTA HEAD: 1. Severe stenosis RIGHT internal carotid artery at anterior genu/supraclinoid segment due to advanced atherosclerosis. 2. 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 3. Occluded RIGHT PCA at proximal P2 segment, poor collateralization and, likely acute given recent MRI findings. 4. Severe stenosis RIGHT M2 and LEFT P3 segments. Moderate stenosis RIGHT M1 segment. 5. 2 mm RIGHT PCOM origin aneurysm.  Aortic Atherosclerosis (ICD10-I70.0). Electronically Signed   By: Elon Alas M.D.   On: 06/14/2017 02:28   Ct Head Wo Contrast  Result Date: 06/13/2017 CLINICAL DATA:  Bilateral lower extremity weakness for 2 days. History of stroke with residual right-sided weakness. EXAM: CT HEAD WITHOUT CONTRAST TECHNIQUE: Contiguous axial images were obtained from the base of the skull through the vertex without intravenous contrast. COMPARISON:  None. FINDINGS: Brain: There is a moderate-sized chronic left MCA infarct in the parietal lobe. A chronic lateral lenticulostriate infarct is noted involving the anterior limb of the left internal capsule and basal ganglia. There is slight ex vacuo enlargement of the left lateral ventricle. There is no evidence of acute infarct, intracranial hemorrhage, mass, midline shift, or extra-axial fluid collection. Vascular: Calcified atherosclerosis at the skullbase. No hyperdense vessel. Skull: No fracture or focal osseous lesion. Sinuses/Orbits: Unremarkable orbits. Partially visualized small osteoma in the left maxillary sinus. Clear mastoid air cells. Other: None. IMPRESSION: 1. No evidence of acute intracranial abnormality. 2. Chronic left parietal and basal ganglia infarcts. Electronically Signed   By: Logan Bores M.D.   On: 06/13/2017 10:47   Ct Angio Neck W Or Wo Contrast  Result Date: 06/14/2017 CLINICAL DATA:  Follow-up stroke and cerebral artery occlusion. EXAM: CT ANGIOGRAPHY HEAD AND NECK TECHNIQUE: Multidetector CT imaging of the head and neck was performed using the standard protocol during bolus administration of intravenous contrast. Multiplanar CT image reconstructions and MIPs were obtained to evaluate the vascular anatomy. Carotid stenosis measurements (when applicable) are obtained utilizing NASCET criteria, using the distal internal carotid diameter as the denominator. CONTRAST:  50 cc Isovue 370 COMPARISON:  MRI and MRA head June 13, 2017 FINDINGS:  CTA NECK AORTIC ARCH: Normal appearance of the thoracic arch, 2 vessel arch is a normal variant. Mild calcific atherosclerosis aortic arch. Intimal thickening resulting in moderate stenosis RIGHT subclavian artery origin. RIGHT CAROTID SYSTEM: Common carotid artery is widely patent, retropharyngeal course. Mild eccentric calcific atherosclerosis RIGHT carotid bifurcation without hemodynamically significant stenosis by NASCET criteria. RIGHT tonsillar loop. LEFT CAROTID SYSTEM: Common carotid artery is widely patent, coursing in a straight line fashion. Retropharyngeal course. Mild eccentric intimal thickening without hemodynamically significant stenosis by NASCET criteria. VERTEBRAL ARTERIES:Left vertebral artery is dominant. Patent bilateral vertebral artery's, mild extrinsic deformity due to degenerative cervical spine. SKELETON: No acute osseous process though bone windows have not been submitted. OTHER NECK: Soft tissues of the neck are nonacute though, not tailored for evaluation. Reversed cervical lordosis with severe C5-6 and C6-7 degenerative discs, moderate at C4-5. Severe canal stenosis C5-6. Moderate to severe C5-6 and C6-7 neural foraminal  narrowing. UPPER CHEST: Included lung apices are clear. No superior mediastinal lymphadenopathy. Mild suspected cardiomegaly with hazy ground-glass opacities and pulmonary vascular congestion. CTA HEAD ANTERIOR CIRCULATION: Calcific atherosclerosis resulting in severe stenosis RIGHT anterior genu of the internal carotid artery to paraophthalmic segment. 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 Moderate stenosis LEFT supraclinoid internal carotid artery. Thready RIGHT A1 segment with moderate luminal irregularity LEFT A1 segment. Patent anterior communicating artery. Bilateral anterior cerebral artery's are patent. Occluded LEFT M2 origin, superior division. Thready inferior division with poor collateralization in pruning. Moderate  stenosis RIGHT M1 segment. Severe stenosis proximal RIGHT M2 segment. No large vessel occlusion, significant stenosis, contrast extravasation or aneurysm. POSTERIOR CIRCULATION: Patent vertebral arteries, vertebrobasilar junction and basilar artery, as well as main branch vessels. Occluded RIGHT proximal P2 segment with mild collateralization. Severe stenosis LEFT P3 origin. No large vessel occlusion, significant stenosis, contrast extravasation or aneurysm. VENOUS SINUSES: Major dural venous sinuses are patent though not tailored for evaluation on this angiographic examination. ANATOMIC VARIANTS: None. DELAYED PHASE: No abnormal intracranial enhancement. MIP images reviewed. IMPRESSION: CTA NECK: 1. Atherosclerosis without hemodynamically significant stenosis or acute vascular process in the neck. 2. Suspected cardiomegaly and pulmonary edema. Recommend chest radiograph. 3. Degenerative cervical spine resulting in severe canal stenosis C5-6. Moderate to severe C5-6 and C6-7 neural foraminal narrowing. CTA HEAD: 1. Severe stenosis RIGHT internal carotid artery at anterior genu/supraclinoid segment due to advanced atherosclerosis. 2. 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 3. Occluded RIGHT PCA at proximal P2 segment, poor collateralization and, likely acute given recent MRI findings. 4. Severe stenosis RIGHT M2 and LEFT P3 segments. Moderate stenosis RIGHT M1 segment. 5. 2 mm RIGHT PCOM origin aneurysm. Aortic Atherosclerosis (ICD10-I70.0). Electronically Signed   By: Elon Alas M.D.   On: 06/14/2017 02:28   Mr Brain Wo Contrast (neuro Protocol)  Result Date: 06/13/2017 CLINICAL DATA:  Initial evaluation for acute left leg weakness. EXAM: MRI HEAD WITHOUT CONTRAST TECHNIQUE: Multiplanar, multiecho pulse sequences of the brain and surrounding structures were obtained without intravenous contrast. COMPARISON:  Prior CT from earlier the same day. FINDINGS: Brain: Generalized age appropriate  cerebral atrophy. Encephalomalacia with gliosis within the posterior left frontoparietal region compatible with remote left MCA territory infarct. Additional remote lacunar type infarcts present within the left basal ganglia. Approximate 1 cm focus of restricted diffusion at the lateral right midbrain at the level the right cortical spinal tract (series 3, image 24), consistent with an acute ischemic infarct. No associated mass effect or hemorrhage. No other evidence for acute or subacute ischemia. No other areas of chronic infarction. No other acute or chronic intracranial hemorrhage. No mass lesion, midline shift or mass effect. No hydrocephalus. No extra-axial fluid collection. Major dural sinuses are grossly patent. Pituitary gland suprasellar region within normal limits. Vascular: Major intracranial vascular flow voids maintained. Skull and upper cervical spine: Craniocervical junction normal. Mild degenerate spondylolysis noted within the upper cervical spine without significant stenosis. Bone marrow signal intensity within normal limits. No scalp soft tissue abnormality. Sinuses/Orbits: Globes and orbital soft tissues within normal limits. Mild scattered mucosal thickening within the ethmoidal air cells and maxillary sinuses. Paranasal sinuses are otherwise clear. No mastoid effusion. Inner ear structures normal. IMPRESSION: 1. 1 cm acute ischemic nonhemorrhagic infarct involving the lateral right midbrain at the level the right corticospinal tract. No associated mass effect. 2. Remote posterior left MCA territory and left basal ganglia infarcts as above. Electronically Signed   By: Pincus Badder.D.  On: 06/13/2017 18:15   Mr Lumbar Spine Wo Contrast  Result Date: 06/13/2017 CLINICAL DATA:  Initial evaluation for acute back pain, leg weakness. EXAM: MRI LUMBAR SPINE WITHOUT CONTRAST TECHNIQUE: Multiplanar, multisequence MR imaging of the lumbar spine was performed. No intravenous contrast was  administered. COMPARISON:  None. FINDINGS: Segmentation: Normal segmentation. Lowest well-formed disc labeled the L5-S1 level. Alignment: 4 mm anterolisthesis of L4 on L5. Trace retrolisthesis of L2 on L3 and L3 on L4. Vertebral bodies otherwise normally aligned with preservation of the normal lumbar lordosis. Vertebrae: Vertebral body heights maintained. No evidence for acute or chronic fracture. Signal intensity within the vertebral body bone marrow within normal limits. No discrete or worrisome osseous lesions. No abnormal marrow edema. Conus medullaris: Extends to the L1 level and appears normal. Paraspinal and other soft tissues: Paraspinous soft tissues demonstrate no acute abnormality. Fibroid uterus noted. Partially visualized visceral structures otherwise unremarkable. Disc levels: Mild diffuse congenital shortening of the pedicles noted. L1-2:  Unremarkable. L2-3: Diffuse degenerative disc bulge with disc desiccation. Superimposed left foraminal/ extraforaminal disc protrusion, contacting the exiting left L2 nerve root as it courses of the left neural foramen (series 4, image 11). No significant canal stenosis. Mild bilateral L2 foraminal stenosis related to disc bulge and short pedicles. L3-4: Diffuse disc bulge with disc desiccation and mild intervertebral disc space narrowing. Mild facet and ligamentum flavum hypertrophy. Short pedicles. Resultant moderate canal and bilateral subarticular stenosis. Mild to moderate bilateral L3 foraminal narrowing. L4-5: 4 mm anterolisthesis of L4 on L5. Associated diffuse disc bulge. Moderate bilateral facet arthrosis with ligamentum flavum hypertrophy, slightly worse on the right. Short pedicles. Severe canal and bilateral subarticular stenosis. Moderate bilateral L4 foraminal narrowing, right worse than left. L5-S1: Diffuse disc bulge with disc desiccation and intervertebral disc space narrowing. Superimposed left subarticular disc protrusion encroaches upon the left  lateral recess, impinging upon the descending left S1 nerve root (series 5, image 25). Mild bilateral facet hypertrophy. Relatively mild canal narrowing. Mild left L5 foraminal stenosis present as well. IMPRESSION: 1. 4 mm anterolisthesis of L4 on L5 with associated disc bulge and moderate facet arthropathy, resulting in severe canal and bilateral subarticular stenosis, with moderate bilateral L4 foraminal narrowing. 2. Left subarticular disc protrusion at L5-S1, impinging upon the descending left S1 nerve root in the left lateral recess. 3. Left foraminal/extraforaminal disc protrusion at L2-3, contacting and potentially irritating the exiting left L2 nerve root. 4. Diffuse congenital shortening of pedicles. 5. Fibroid uterus. Electronically Signed   By: Jeannine Boga M.D.   On: 06/13/2017 18:41   Dg Chest Port 1 View  Result Date: 06/14/2017 CLINICAL DATA:  Acute ischemic stroke.  Hypertension. EXAM: PORTABLE CHEST 1 VIEW COMPARISON:  None. FINDINGS: The heart size and mediastinal contours are within normal limits. Both lungs are clear. The visualized skeletal structures are unremarkable. IMPRESSION: No active disease. Electronically Signed   By: Earle Gell M.D.   On: 06/14/2017 07:43   Mr Jodene Nam Head Wo Contrast  Result Date: 06/13/2017 CLINICAL DATA:  Follow-up acute midbrain infarct. Recent unsteady gait. History of strokes. EXAM: MRA HEAD WITHOUT CONTRAST TECHNIQUE: Angiographic images of the Circle of Willis were obtained using MRA technique without intravenous contrast. COMPARISON:  MRI of the head June 13, 2017 at 1719 hours. FINDINGS: ANTERIOR CIRCULATION: Normal flow related enhancement of the included cervical, petrous, cavernous and supraclinoid internal carotid arteries. 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 Patent fenestrated  anterior communicating artery. Patent anterior  cerebral arteries, including distal segments. Occluded LEFT proximal M2 segment with paucity of mid to distal LEFT middle cerebral artery's. Severe stenosis proximal RIGHT M2 segment. Moderate luminal regularity proximal RIGHT middle cerebral artery's. POSTERIOR CIRCULATION: LEFT vertebral artery is dominant. Basilar artery is patent, with normal flow related enhancement of the main branch vessels. Occluded RIGHT proximal P2 segment with thready collaterals. Small bilateral posterior communicating artery is present. Severe stenosis proximal LEFT P3 segment. No large vessel occlusion, high-grade stenosis,  aneurysm. ANATOMIC VARIANTS: None. Source images and MIP images were reviewed. IMPRESSION: 1. 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 2. Occluded LEFT middle cerebral artery at M2 origin with poor collateralization. 3. Severe stenosis proximal RIGHT M2 segment and proximal LEFT P3 segment. 4. Focal irregularity and probable stenosis RIGHT supraclinoid internal carotid artery. 5. 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 6. Recommend CTA for further characterization. Acute findings discussed with and reconfirmed by Dr.TIMOTHY OPYD on 06/13/2017 at 11:50 pm. Electronically Signed   By: Elon Alas M.D.   On: 06/13/2017 23:50     Assessment/Plan: Diagnosis: right midbrain/corticospinal infarct, lumbosacral spondylosis with radiculopathy 1. Does the need for close, 24 hr/day medical supervision in concert with the patient's rehab needs make it unreasonable for this patient to be served in a less intensive setting? Yes 2. Co-Morbidities requiring supervision/potential complications: htn, hx of seizure/stroke 3. Due to bladder management, bowel management, safety, skin/wound care, disease management, medication administration, pain management and patient education, does the patient require 24 hr/day rehab nursing? Yes 4. Does the patient require  coordinated care of a physician, rehab nurse, PT (1-2 hrs/day, 5 days/week), OT (1-2 hrs/day, 5 days/week) and SLP (1-2 hrs/day, 5 days/week) to address physical and functional deficits in the context of the above medical diagnosis(es)? Yes Addressing deficits in the following areas: balance, endurance, locomotion, strength, transferring, bowel/bladder control, bathing, dressing, feeding, grooming, toileting, cognition, speech and psychosocial support 5. Can the patient actively participate in an intensive therapy program of at least 3 hrs of therapy per day at least 5 days per week? Yes 6. The potential for patient to make measurable gains while on inpatient rehab is excellent 7. Anticipated functional outcomes upon discharge from inpatient rehab are modified independent  with PT, modified independent with OT, modified independent with SLP. 8. Estimated rehab length of stay to reach the above functional goals is: 13-19 days 9. Anticipated D/C setting: Home 10. Anticipated post D/C treatments: [employer] therapy 11. Overall Rehab/Functional Prognosis: excellent  RECOMMENDATIONS: This patient's condition is appropriate for continued rehabilitative care in the following setting: CIR Patient has agreed to participate in recommended program. Yes Note that insurance prior authorization may be required for reimbursement for recommended care.  Comment: Rehab Admissions Coordinator to follow up.  Thanks,  Meredith Staggers, MD, [place], [place] 06/14/2017    Revision History                   Routing History

## 2017-06-17 NOTE — Progress Notes (Signed)
I met with pt and her boyfriend at bedside to discuss an inpt rehab admission. They are in agreement to admit. I contacted Dr. Alfredia Ferguson and he is in agreement to d/c to rehab today. I will make the arrangements. [phone]

## 2017-06-18 ENCOUNTER — Inpatient Hospital Stay (HOSPITAL_COMMUNITY): Payer: Medicare Other | Admitting: Occupational Therapy

## 2017-06-18 ENCOUNTER — Inpatient Hospital Stay (HOSPITAL_COMMUNITY): Payer: Medicare Other | Admitting: Physical Therapy

## 2017-06-18 ENCOUNTER — Inpatient Hospital Stay (HOSPITAL_COMMUNITY): Payer: Medicare Other | Admitting: Speech Pathology

## 2017-06-18 ENCOUNTER — Inpatient Hospital Stay (HOSPITAL_COMMUNITY): Payer: Medicare Other

## 2017-06-18 DIAGNOSIS — I10 Essential (primary) hypertension: Secondary | ICD-10-CM

## 2017-06-18 DIAGNOSIS — M5440 Lumbago with sciatica, unspecified side: Secondary | ICD-10-CM

## 2017-06-18 DIAGNOSIS — M5441 Lumbago with sciatica, right side: Secondary | ICD-10-CM

## 2017-06-18 DIAGNOSIS — R7303 Prediabetes: Secondary | ICD-10-CM

## 2017-06-18 DIAGNOSIS — M5416 Radiculopathy, lumbar region: Secondary | ICD-10-CM

## 2017-06-18 DIAGNOSIS — F5101 Primary insomnia: Secondary | ICD-10-CM

## 2017-06-18 DIAGNOSIS — G8929 Other chronic pain: Secondary | ICD-10-CM

## 2017-06-18 DIAGNOSIS — R569 Unspecified convulsions: Secondary | ICD-10-CM

## 2017-06-18 LAB — CBC WITH DIFFERENTIAL/PLATELET
BASOS PCT: 0 %
Basophils Absolute: 0.1 10*3/uL (ref 0.0–0.1)
EOS ABS: 0.3 10*3/uL (ref 0.0–0.7)
Eosinophils Relative: 3 %
HEMATOCRIT: 38.9 % (ref 36.0–46.0)
HEMOGLOBIN: 12.4 g/dL (ref 12.0–15.0)
LYMPHS ABS: 2.8 10*3/uL (ref 0.7–4.0)
Lymphocytes Relative: 25 %
MCH: 28.6 pg (ref 26.0–34.0)
MCHC: 31.9 g/dL (ref 30.0–36.0)
MCV: 89.8 fL (ref 78.0–100.0)
Monocytes Absolute: 0.8 10*3/uL (ref 0.1–1.0)
Monocytes Relative: 7 %
NEUTROS ABS: 7.3 10*3/uL (ref 1.7–7.7)
NEUTROS PCT: 65 %
Platelets: 306 10*3/uL (ref 150–400)
RBC: 4.33 MIL/uL (ref 3.87–5.11)
RDW: 15 % (ref 11.5–15.5)
WBC: 11.3 10*3/uL — AB (ref 4.0–10.5)

## 2017-06-18 LAB — COMPREHENSIVE METABOLIC PANEL
ALBUMIN: 3.7 g/dL (ref 3.5–5.0)
ALK PHOS: 54 U/L (ref 38–126)
ALT: 44 U/L (ref 14–54)
AST: 38 U/L (ref 15–41)
Anion gap: 8 (ref 5–15)
BILIRUBIN TOTAL: 0.3 mg/dL (ref 0.3–1.2)
BUN: 15 mg/dL (ref 6–20)
CALCIUM: 9.9 mg/dL (ref 8.9–10.3)
CO2: 23 mmol/L (ref 22–32)
CREATININE: 0.72 mg/dL (ref 0.44–1.00)
Chloride: 107 mmol/L (ref 101–111)
GFR calc Af Amer: >60 mL/min (ref >60)
GFR calc non Af Amer: >60 mL/min (ref >60)
GLUCOSE: 129 mg/dL — AB (ref 65–99)
Potassium: 4.2 mmol/L (ref 3.5–5.1)
SODIUM: 138 mmol/L (ref 135–145)
Total Protein: 7.3 g/dL (ref 6.5–8.1)

## 2017-06-18 LAB — MPO/PR-3 (ANCA) ANTIBODIES
ANCA Proteinase 3: <3.5 U/mL (ref 0.0–3.5)
Myeloperoxidase Abs: <9 U/mL (ref 0.0–9.0)

## 2017-06-18 LAB — ANTINUCLEAR ANTIBODIES, IFA: ANTINUCLEAR ANTIBODIES, IFA: NEGATIVE

## 2017-06-18 NOTE — Evaluation (Signed)
Speech Language Pathology Assessment and Plan  Patient Details  Name: Leslie Simmons MRN: [id number] Date of Birth: 07/09/1966   Today's Date: 06/18/2017 SLP Individual Time: 1405-1500 SLP Individual Time Calculation (min): 55 min   Problem List:  Patient Active Problem List   Diagnosis Date Noted  . Lumbar radiculopathy   . Chronic bilateral low back pain with sciatica   . Benign essential HTN   . Seizures ([place])   . Primary insomnia   . Prediabetes   . Stroke due to embolism of right posterior cerebral artery ([place]) 06/17/2017  . Constipation 06/16/2017  . History of stroke   . Hyperlipidemia   . Acute ischemic stroke ([place]) 06/13/2017  . Lumbar spinal stenosis 06/13/2017  . Hypertensive urgency 06/13/2017  . Essential hypertension 06/13/2017  . History of hemorrhagic stroke with residual hemiparesis ([place]) 06/13/2017  . History of seizure 06/13/2017  . Left leg weakness 06/13/2017   Past Medical History:  Past Medical History:  Diagnosis Date  . Hypertension   . Stroke [facility])    Past Surgical History: History reviewed. No pertinent surgical history.  Assessment / Plan / Recommendation Clinical Impression   Leslie Simmons a 51 y.o.femalewith history fo HTN, hemorrhagic stroke with residual right sided weakness/numbnessand seizure who was admitted on 06/13/17 with reports of back pain and LLE weakness since the night before. MRI brain done revealing acute nonhemorrhagic infarct involving lateral right midbrain at right corticospinal tract and Remote L-MCA and basal ganglia infarcts.MRI lumbar spine done revealing L4 onL5 anterolisthesis with severe canal and bilateral subarticular stenosis and left subarticular Disc protrusion L5-S1 with impingement of S1 nerve. Work up ongoing and NS consulted for input on stenosis. Dr. Ronnald Ramp recommended therapy and elected decompression in the future. Therapy evaluations initiated revealing deficits in gait, ability to ADLs, lability and mild  dysarthria with mild deficits in working memory. CIR recommended by rehab team.  SLP evaluation completed on 06/18/2017 with the following results:  Pt presents with mild baseline cognitive impairments characterized by decreased recall of new information and decreased executive functioning skills.  Pt demonstrates good awareness of these deficits and was able to compensate for them with distant supervision.  As a result, no further ST needs are indicated at this time.    Skilled Therapeutic Interventions          Cognitive-linguistic evaluation completed with results and recommendations reviewed with patient.  Pt was 100% accurate with mod I for counting money and making change on money subtests of ALFA standardized cognitive assessment.  Pt needed intermittent supervision verbal cues to recognize and correct errors for functional, daily math calculations from word problems.  Pt was able to organize medications into a medication chart with supervision cues as well.  Pt's daughter was providing assistance for medication and financial management prior to admission given long term cognitive deficits.  Pt feels daughter and her boyfriend will be able to provide this level of assistance once she is discharged.  Pt in agreement with no further ST needs.      SLP Assessment  Patient does not need any further Speech Lanaguage Pathology Services    Recommendations  Recommendations for Other Services: Neuropsych consult Follow up Recommendations: None Equipment Recommended: None recommended by SLP                     Pain Pain Assessment Pain Assessment: No/denies pain  Prior Functioning Cognitive/Linguistic Baseline: Baseline deficits Baseline deficit details: pt endorses long term memory deficits s/p  first CVA  Type of Home: House  Lives With: Significant other Available Help at Discharge: Available PRN/intermittently Vocation: On disability  Function:  Eating Eating                Cognition Comprehension Comprehension assist level: Follows complex conversation/direction with no assist  Expression   Expression assist level: Expresses basic needs/ideas: With no assist  Social Interaction Social Interaction assist level: Interacts appropriately with others with medication or extra time (anti-anxiety, antidepressant).  Problem Solving Problem solving assist level: Solves basic problems with no assist  Memory Memory assist level: Recognizes or recalls 90% of the time/requires cueing < 10% of the time      Refer to Care Plan for Long Term Goals  Recommendations for other services: Neuropsych  Discharge Criteria: Patient will be discharged from SLP if patient refuses treatment 3 consecutive times without medical reason, if treatment goals not met, if there is a change in medical status, if patient makes no progress towards goals or if patient is discharged from hospital.  The above assessment, treatment plan, treatment alternatives and goals were discussed and mutually agreed upon: by patient  [name] 06/18/2017, 4:27 PM

## 2017-06-18 NOTE — Plan of Care (Signed)
Problem: RH BLADDER ELIMINATION Goal: RH STG MANAGE BLADDER WITH ASSISTANCE STG Manage Bladder With Mod I Assistance.  Outcome: Progressing Patient requires only supervision during toileting.  Problem: RH SAFETY Goal: RH STG ADHERE TO SAFETY PRECAUTIONS W/ASSISTANCE/DEVICE STG Adhere to Safety Precautions With Mod I Assistance/Device.  Outcome: Progressing Appropriate safety awareness.  Problem: RH PAIN MANAGEMENT Goal: RH STG PAIN MANAGED AT OR BELOW PT'S PAIN GOAL Pt pain will be < or =2 on scale 0-10  Outcome: Not Progressing Patient verbalizes pain score of 7/10.

## 2017-06-18 NOTE — Progress Notes (Signed)
Physical Therapy Session Note  Patient Details  Name: Leslie Simmons MRN: [id number] Date of Birth: 02/13/1966  Today's Date: 06/18/2017 PT Individual Time: 0304-0335 PT Individual Time Calculation (min): 31 min   Short Term Goals: Week 1:  PT Short Term Goal 1 (Week 1): =LTGs due to ELOS  Skilled Therapeutic Interventions/Progress Updates:   Pt in w/c upon arrival and agreeable to therapy, pain as detailed below but improved as RN provided pain medication. Worked on endurance and LE strength this session. NuStep @ L2, 15 min w/o rest breaks. Educated boyfriend on assisting pt w/c to/from toilet. Boyfriend returned demonstration safely and understood appropriate safety precautions to make. Ended session in w/c in care of boyfriend, all needs met.   Therapy Documentation Precautions:  Precautions Precautions: Fall Precaution Comments: mild Left hemiparesis Restrictions Weight Bearing Restrictions: No Vital Signs: Therapy Vitals Temp: 98.2 F (36.8 C) Temp Source: Oral Pulse Rate: 83 Resp: 18 BP: 135/79 Patient Position (if appropriate): Lying Oxygen Therapy SpO2: 100 % O2 Device: Not Delivered   See Function Navigator for Current Functional Status.   Therapy/Group: Individual Therapy  [name] K Arnette 06/18/2017, 4:12 PM

## 2017-06-18 NOTE — Evaluation (Signed)
Physical Therapy Assessment and Plan  Patient Details  Name: Leslie Simmons MRN: [id number] Date of Birth: 24-Apr-1966  PT Diagnosis: Difficulty walking, Hemiplegia non-dominant, Impaired sensation, Low back pain and Muscle weakness Rehab Potential: Good ELOS: 7-10 days   Today's Date: 06/18/2017 PT Individual Time: 0900-1001 PT Individual Time Calculation (min): 61 min    Problem List:  Patient Active Problem List   Diagnosis Date Noted  . Lumbar radiculopathy   . Chronic bilateral low back pain with sciatica   . Benign essential HTN   . Seizures ([place])   . Primary insomnia   . Prediabetes   . Stroke due to embolism of right posterior cerebral artery ([place]) 06/17/2017  . Constipation 06/16/2017  . History of stroke   . Hyperlipidemia   . Acute ischemic stroke ([place]) 06/13/2017  . Lumbar spinal stenosis 06/13/2017  . Hypertensive urgency 06/13/2017  . Essential hypertension 06/13/2017  . History of hemorrhagic stroke with residual hemiparesis ([place]) 06/13/2017  . History of seizure 06/13/2017  . Left leg weakness 06/13/2017    Past Medical History:  Past Medical History:  Diagnosis Date  . Hypertension   . Stroke [facility])    Past Surgical History: History reviewed. No pertinent surgical history.  Assessment & Plan Clinical Impression: Patient is a 51 y.o. year old female with history fo HTN, hemorrhagic stroke with residual right sided weakness/numbnessand seizure who was admitted on 06/13/17 with reports of back pain and LLE weakness since the night before. MRI brain done revealing acute nonhemorrhagic infarct involving lateral right midbrain at right corticospinal tract and Remote L-MCA and basal ganglia infarcts.MRI lumbar spine done revealing L4 onL5 anterolisthesis with severe canal and bilateral subarticular stenosis and left subarticular Disc protrusion L5-S1 with impingement of S1 nerve. Work up ongoing and NS consulted for input on stenosis. Dr. Ronnald Ramp recommended therapy and  elected decompression in the future. Therapy evaluations initiated revealing deficits in gait, ability to ADLs, lability and mild dysarthria with mild deficits in working memory. CIR recommended by rehab team Patient transferred to CIR on 06/17/2017 .   Patient currently requires min with mobility secondary to muscle weakness, decreased coordination and decreased sitting balance, decreased standing balance, hemiplegia and decreased balance strategies.  Prior to hospitalization, patient was modified independent  with mobility and lived with Significant other in a House (with boyfriend) home.  Home access is  Level entry.  Patient will benefit from skilled PT intervention to maximize safe functional mobility, minimize fall risk and decrease caregiver burden for planned discharge home alone.  Anticipate patient will benefit from follow up [place] at discharge.  PT - End of Session Activity Tolerance: Decreased this session;Tolerates 30+ min activity with multiple rests Endurance Deficit: Yes PT Assessment Rehab Potential (ACUTE/IP ONLY): Good PT Barriers to Discharge: Decreased caregiver support PT Patient demonstrates impairments in the following area(s): Balance;Endurance;Motor;Pain;Sensory;Behavior;Safety PT Transfers Functional Problem(s): Bed Mobility;Bed to Chair;Car;Furniture;Floor PT Locomotion Functional Problem(s): Ambulation;Wheelchair Mobility;Stairs PT Plan PT Intensity: Minimum of 1-2 x/day ,45 to 90 minutes PT Frequency: 5 out of 7 days PT Duration Estimated Length of Stay: 7-10 days PT Treatment/Interventions: Ambulation/gait training;Balance/vestibular training;Community reintegration;Functional electrical stimulation;Patient/family education;Stair training;UE/LE Coordination activities;UE/LE Strength taining/ROM;Splinting/orthotics;Pain management;DME/adaptive equipment instruction;Disease management/prevention;Neuromuscular re-education;Therapeutic Exercise;Wheelchair  propulsion/positioning;Therapeutic Activities;Functional mobility training;Discharge planning;Visual/perceptual remediation/compensation PT Transfers Anticipated Outcome(s): Mod I PT Locomotion Anticipated Outcome(s): Mod I PT Recommendation Follow Up Recommendations: Home health PT Patient destination: Home Equipment Recommended: To be determined  Skilled Therapeutic Intervention Evaluation completed (see details above and below) with education on PT POC  and goals and individual treatment initiated with focus on functional mobility, balance and patient education. Pt performed bed mobility, bed<>w/c transfers and car transfer with min assist, verbal cues for technique. Pt ambulated x 150 ft using RW and min assist, pt with decreased L knee control requiring verbal and tactile cues for L knee extension during stance phase. Pt ascended/descended 12 steps using B handrails and min assist, verbal cues for technique. Completed Berg balance scale, pt scored a 36/56 and discussed results with the patient, difficulty with tasks requiring narrow base of support. Pt propelled w/c x 150 ft within the unit and her room with supervision, using B UEs. Pt left seated in w/c at end of session with needs in reach.    PT Evaluation Precautions/Restrictions Precautions Precautions: Fall Restrictions Weight Bearing Restrictions: No General   Vital Signs Pain Pain Assessment Pain Assessment: 0-10 Pain Score: 0-No pain Home Living/Prior Functioning Home Living Available Help at Discharge: Family;Available PRN/intermittently (daughter) Type of Home: House (with boyfriend) Home Access: Level entry Home Layout: One level Additional Comments: pt lived in apartment with daughter in [place] with 8 steps to get in, was visiting her boyfriend here in [place] and plans to stay here until you can go back to [place]. Has a RW.   Lives With: Significant other Prior Function Level of Independence: Independent  with gait;Other (comment) (used a RW depending on how far she had to walk, previous stroke affecting R LE, had someone to help with cooking, cleaning, bathing, take to Dr appointments) Driving: No Vocation: On disability Leisure: Hobbies-yes (Comment) Comments: Pt would walk for leisure/exercise about 4 times per week, about a mile Cognition Overall Cognitive Status: Within Functional Limits for tasks assessed Arousal/Alertness: Awake/alert Orientation Level: Oriented X4 Attention: Sustained Sustained Attention: Appears intact Memory: Impaired Memory Impairment: Retrieval deficit Awareness: Appears intact Safety/Judgment: Appears intact Sensation Sensation Light Touch: Impaired by gross assessment (Impaired sensation on L LE, able to tell light touch but reports tingling and different sensation than R LE) Stereognosis: Not tested Hot/Cold: Not tested Proprioception: Impaired by gross assessment Coordination Gross Motor Movements are Fluid and Coordinated: No Fine Motor Movements are Fluid and Coordinated: No Heel Shin Test: impaired on L LE Motor  Motor Motor: Hemiplegia;Other (comment) (L hemiplegia, residual R hemiplegia from previous stroke)  Trunk/Postural Assessment  Cervical Assessment Cervical Assessment: Within Functional Limits Thoracic Assessment Thoracic Assessment: Within Functional Limits Lumbar Assessment Lumbar Assessment: Within Functional Limits Postural Control Postural Control: Within Functional Limits  Balance Balance Balance Assessed: Yes Standardized Balance Assessment Standardized Balance Assessment: Berg Balance Test Berg Balance Test Sit to Stand: Able to stand  independently using hands Standing Unsupported: Able to stand 2 minutes with supervision Sitting with Back Unsupported but Feet Supported on Floor or Stool: Able to sit safely and securely 2 minutes Stand to Sit: Controls descent by using hands Transfers: Able to transfer safely,  definite need of hands Standing Unsupported with Eyes Closed: Able to stand 10 seconds with supervision Standing Ubsupported with Feet Together: Able to place feet together independently and stand for 1 minute with supervision From Standing, Reach Forward with Outstretched Arm: Can reach forward >12 cm safely (5") From Standing Position, Pick up Object from Floor: Able to pick up shoe, needs supervision From Standing Position, Turn to Look Behind Over each Shoulder: Needs supervision when turning Turn 360 Degrees: Able to turn 360 degrees safely but slowly Standing Unsupported, Alternately Place Feet on Step/Stool: Able to complete >2 steps/needs minimal assist (completed 8  steps but with min assist) Standing Unsupported, One Foot in Front: Loses balance while stepping or standing Standing on One Leg: Able to lift leg independently and hold > 10 seconds Total Score: 36 Static Sitting Balance Static Sitting - Level of Assistance: 5: Stand by assistance Dynamic Sitting Balance Dynamic Sitting - Level of Assistance: 5: Stand by assistance Static Standing Balance Static Standing - Level of Assistance: 4: Min assist Dynamic Standing Balance Dynamic Standing - Level of Assistance: 4: Min assist Extremity Assessment  RLE Assessment RLE Assessment: Exceptions to [facility] (generalized weakness and some mild residual weakness from previous stroke, grossly 4/5 throughout) LLE Assessment LLE Assessment: Exceptions to [facility] LLE Strength Left Hip Flexion: 3-/5 Left Hip ABduction: 3+/5 Left Hip ADduction: 3+/5 Left Knee Flexion: 3-/5 Left Knee Extension: 3-/5 Left Ankle Dorsiflexion: 3-/5 Left Ankle Plantar Flexion: 3/5   See Function Navigator for Current Functional Status.   Refer to Care Plan for Long Term Goals  Recommendations for other services: None   Discharge Criteria: Patient will be discharged from PT if patient refuses treatment 3 consecutive times without medical reason, if treatment  goals not met, if there is a change in medical status, if patient makes no progress towards goals or if patient is discharged from hospital.  The above assessment, treatment plan, treatment alternatives and goals were discussed and mutually agreed upon: by patient  Netta Corrigan, PT, DPT 06/18/2017, 10:25 AM

## 2017-06-18 NOTE — Progress Notes (Signed)
[place] PHYSICAL MEDICINE & REHABILITATION     PROGRESS NOTE  Subjective/Complaints:  Pt seen laying in bed this AM.  She slept well overnight.  She is ready to therapies, and particularly looking forward to getting ready this AM.  She has questions about her meds.   ROS: Denies CP, SOB, N/V/D.  Objective: Vital Signs: Blood pressure (!) 142/91, pulse 94, temperature 98.3 F (36.8 C), temperature source Oral, resp. rate 18, height 5' (1.524 m), weight 79.3 kg (174 lb 14.4 oz), SpO2 94 %. No results found. No results for input(s): WBC, HGB, HCT, PLT in the last 72 hours. No results for input(s): NA, K, CL, GLUCOSE, BUN, CREATININE, CALCIUM in the last 72 hours.  Invalid input(s): CO CBG (last 3)   Recent Labs  06/16/17 1225  GLUCAP 146*    Wt Readings from Last 3 Encounters:  06/17/17 79.3 kg (174 lb 14.4 oz)  06/13/17 80.5 kg (177 lb 7.5 oz)    Physical Exam:  BP (!) 142/91 (BP Location: Right Arm)   Pulse 94   Temp 98.3 F (36.8 C) (Oral)   Resp 18   Ht 5' (1.524 m)   Wt 79.3 kg (174 lb 14.4 oz)   SpO2 94%   BMI 34.16 kg/m  Constitutional: She appears well-developedand well-nourished. No distress.  HENT: Normocephalicand atraumatic.  Eyes: EOMare normal. No discharge.  Cardiovascular: Normal rate, regular rhythm. No JVD. Respiratory: Effort normaland breath sounds normal.  GI: Bowel sounds are normal. She exhibits no distension. Musculoskeletal: She exhibits no edemaor tenderness.  Neurological: She is alertand oriented.  Able to follow basic commands.  Has poor insight into deficits.  LUE: 4/5 proximal to distal RUE: 4+/5 proximal to distal LLE: 4-/5 proximally, 4/5 distally RLE: 4-/5 proximally, 4/5 distally Skin: Skin is warmand dry. She is not diaphoretic.  Psychiatric: She has a normal mood and affect. She is slowed. She expresses inappropriate judgment.    Assessment/Plan: 1. Functional deficits secondary to right midbrain infarct,  lumbosacral spondylosis with radiculopathy which require 3+ hours per day of interdisciplinary therapy in a comprehensive inpatient rehab setting. Physiatrist is providing close team supervision and 24 hour management of active medical problems listed below. Physiatrist and rehab team continue to assess barriers to discharge/monitor patient progress toward functional and medical goals.  Function:  Bathing Bathing position      Bathing parts      Bathing assist        Upper Body Dressing/Undressing Upper body dressing                    Upper body assist        Lower Body Dressing/Undressing Lower body dressing                                  Lower body assist        Toileting Toileting          Toileting assist     Transfers Chair/bed transfer             Locomotion Ambulation           Wheelchair          Cognition Comprehension Comprehension assist level: Follows complex conversation/direction with no assist  Expression Expression assist level: Expresses complex ideas: With no assist  Social Interaction Social Interaction assist level: Interacts appropriately with others - No medications needed.  Problem Solving  Problem solving assist level: Solves complex problems: Recognizes & self-corrects  Memory Memory assist level: Complete Independence: No helper     Medical Problem List and Plan: 1. Left sided weakness and functional deficitssecondary to right midbrain infarct, lumbosacral spondylosis with radiculopathy  Begin CIR  Images reviewed, notes reviewed. 2. DVT Prophylaxis/Anticoagulation: Pharmaceutical: Lovenox 3. Chronic back pain/Pain Management: Hydrocodone effective.  4. Mood: LCSW to follow for evaluate and support.  5. Neuropsych: This patient is not fully capable of making decisions on herown behalf. 6. Skin/Wound Care: routine pressure relief measures.  7. Fluids/Electrolytes/Nutrition: Monitor I/Os.   Labs  pending 8. HTN: Monitor BP bid.   Monitor with increased mobility 9. Dyslipidemia: On lipitor.  10. Seizure disorder: On lamictal.  11. H/o recurrent strokes: Hypercoagulopathy panel negative.  12. Chronic insomnia: uses ambien at home.  13. Constipation: resolved with miralax.  14. Prediabetes  Cont to monitor   LOS (Days) 1 A FACE TO FACE EVALUATION WAS PERFORMED  Daiwik [place] Lorie Phenix 06/18/2017 7:46 AM

## 2017-06-18 NOTE — Evaluation (Signed)
Occupational Therapy Assessment and Plan  Patient Details  Name: Leslie Simmons MRN: [id number] Date of Birth: 02-05-1966  OT Diagnosis: acute pain, hemiplegia affecting dominant side, muscle weakness (generalized) and pain in joint Rehab Potential: Rehab Potential (ACUTE ONLY): Excellent ELOS: 7-9 days   Today's Date: 06/18/2017 OT Individual Time: 2458-0998 OT Individual Time Calculation (min): 63 min     Problem List:  Patient Active Problem List   Diagnosis Date Noted  . Lumbar radiculopathy   . Chronic bilateral low back pain with sciatica   . Benign essential HTN   . Seizures ([place])   . Primary insomnia   . Prediabetes   . Stroke due to embolism of right posterior cerebral artery ([place]) 06/17/2017  . Constipation 06/16/2017  . History of stroke   . Hyperlipidemia   . Acute ischemic stroke ([place]) 06/13/2017  . Lumbar spinal stenosis 06/13/2017  . Hypertensive urgency 06/13/2017  . Essential hypertension 06/13/2017  . History of hemorrhagic stroke with residual hemiparesis ([place]) 06/13/2017  . History of seizure 06/13/2017  . Left leg weakness 06/13/2017    Past Medical History:  Past Medical History:  Diagnosis Date  . Hypertension   . Stroke [facility])    Past Surgical History: History reviewed. No pertinent surgical history.  Assessment & Plan Clinical Impression: Patient is a 51 y.o. year old female with recent admission to the hospital on 06/13/17 with reports of back pain and LLE weakness since the night before. MRI brain done revealing acute nonhemorrhagic infarct involving lateral right midbrain at right corticospinal tract and Remote L-MCA and basal ganglia infarcts.MRI lumbar spine done revealing L4 onL5 anterolisthesis with severe canal and bilateral subarticular stenosis and left subarticular Disc protrusion L5-S1 with impingement of S1 nerve. Work up ongoing and NS consulted for input on stenosis. Dr. Ronnald Ramp recommended therapy and elected decompression in the future .   Patient transferred to CIR on 06/17/2017 .    Patient currently requires min with basic self-care skills secondary to muscle weakness and muscle joint tightness, impaired timing and sequencing and decreased coordination, decreased memory and decreased standing balance, hemiplegia and decreased balance strategies.  Prior to hospitalization, patient could complete ADLs with independent .  Patient will benefit from skilled intervention to decrease level of assist with basic self-care skills and increase independence with basic self-care skills prior to discharge home with care partner.  Anticipate patient will require intermittent supervision and follow up home health.  OT - End of Session Activity Tolerance: Tolerates 30+ min activity with multiple rests Endurance Deficit: Yes OT Assessment Rehab Potential (ACUTE ONLY): Excellent OT Barriers to Discharge: Decreased caregiver support OT Patient demonstrates impairments in the following area(s): Balance;Cognition;Motor;Pain;Sensory OT Basic ADL's Functional Problem(s): Grooming;Bathing;Dressing;Toileting OT Advanced ADL's Functional Problem(s): Simple Meal Preparation OT Transfers Functional Problem(s): Toilet;Tub/Shower OT Additional Impairment(s): Fuctional Use of Upper Extremity OT Plan OT Intensity: Minimum of 1-2 x/day, 45 to 90 minutes OT Frequency: 5 out of 7 days OT Duration/Estimated Length of Stay: 7-9 days OT Treatment/Interventions: Balance/vestibular training;Cognitive remediation/compensation;Community reintegration;DME/adaptive equipment instruction;Discharge planning;Pain management;Self Care/advanced ADL retraining;Therapeutic Activities;UE/LE Coordination activities;Therapeutic Exercise;Patient/family education;Functional mobility training;Neuromuscular re-education;UE/LE Strength taining/ROM OT Self Feeding Anticipated Outcome(s): modified independent OT Basic Self-Care Anticipated Outcome(s): modified independent OT Toileting  Anticipated Outcome(s): modified independent OT Bathroom Transfers Anticipated Outcome(s): modified independent OT Recommendation Patient destination: Home Follow Up Recommendations: Home health OT Equipment Recommended: To be determined   Skilled Therapeutic Intervention Pt began selfcare retraining sit to stand shower level.  Pt needed min assist to ambulate  to and from the shower without assistive device secondary to LLE weakness.  She completed most self care, both bathing and dressing with min assist sit to stand.  Noted increased right hand dyscoordinaton compared to left from previous CVA.  Pt with slightly slower coordination on the left compared to baseline with increased time above norms for 9 hole peg test of 39 secs.  Pt uses built up foam grip over utensils to self feed with the RUE.  Prior to first CVA this was her dominant hand, but she has switched and relied more on the left hand since this.  Educated pt on ELOS and expected modified independent level goals.  She understands and agrees with participation in [place] level therapies.  Pt left in wheelchair at end of session with call button and phone in reach and education to use call button and not go get up without assistance from staff.   OT Evaluation Precautions/Restrictions  Precautions Precautions: Fall Precaution Comments: mild Left hemiparesis Restrictions Weight Bearing Restrictions: No  Pain Pain Assessment Pain Assessment: 0-10 Pain Score: 7  Pain Type: Chronic pain Pain Location: Back Pain Orientation: Lower Pain Descriptors / Indicators: Discomfort;Aching Pain Onset: On-going Pain Intervention(s): Repositioned;Emotional support Home Living/Prior [place] expects to be discharged to:: Private residence Living Arrangements: Other (Comment), Spouse/significant other, Children (Undetermined at this time) Available Help at Discharge: Available PRN/intermittently (significant other  works and daughter lives in [place]) Type of Home: House Home Access: Level entry Technical brewer of Steps: 2 [place]: One level Bathroom Shower/Tub: Multimedia programmer: [place] Accessibility: Yes Additional Comments: pt lived in apartment with daughter in [place] with 8 steps to get in, was visiting her boyfriend here in [place] and plans to stay here until you can go back to [place]. Has a RW.   Lives With: Significant other IADL History Homemaking Responsibilities: No Current License: Yes (Has not been driving) Occupation: On disability Prior Function Level of Independence: Independent with basic ADLs, Independent with gait Driving: No Vocation: On disability Leisure: Hobbies-yes (Comment) Comments: Pt would walk for leisure/exercise about 4 times per week, about a mile ADL  See Function Section of chart for details  Vision Baseline Vision/History: Wears glasses Wears Glasses: At all times Patient Visual Report: Blurring of vision Vision Assessment?: Yes Eye Alignment: Within Functional Limits Ocular Range of Motion: Within Functional Limits Alignment/Gaze Preference: Within Defined Limits Tracking/Visual Pursuits: Impaired - to be further tested in functional context;Other (comment) (pt able to track in all directions but did lose fixation at times in the left quadrant with eye blink but could regain immediately) Saccades: Within functional limits Convergence: Within functional limits Visual Fields: No apparent deficits Additional Comments: Pt reports increase in blurry vision with this CVA but nothing else noted.  Perception  Perception: Within Functional Limits Praxis Praxis: Intact Cognition Overall Cognitive Status: History of cognitive impairments - at baseline Arousal/Alertness: Awake/alert Orientation Level: Person;Place;Situation Person: Oriented Place: Oriented Situation: Oriented Year: 2018 Month:  September Day of Week: Incorrect (Monday) Memory: Impaired Memory Impairment: Storage deficit;Decreased recall of new information Immediate Memory Recall: Sock;Blue;Bed Memory Recall: Sock;Blue Memory Recall Sock: Without Cue Memory Recall Blue: Without Cue Attention: Sustained;Selective Sustained Attention: Appears intact Selective Attention: Appears intact Awareness: Appears intact Problem Solving: Appears intact Safety/Judgment: Appears intact Comments: Pt reports some memory issues from previous CVA. Sensation Sensation Light Touch: Impaired Detail (decreased light touch in the right digits) Light Touch Impaired Details: Impaired LUE Stereognosis: Appears Intact Hot/Cold: Appears Intact  Proprioception: Appears Intact Additional Comments: proprioception intact in the UEs with gross testing Coordination Gross Motor Movements are Fluid and Coordinated: Yes Fine Motor Movements are Fluid and Coordinated: No Coordination and Movement Description: Pt with decreased FM coordination in the right hand from previous CVA and slight limitations in the left hand from this CVA.  She was able to fasten her bra and tie her shoes with increased time.  9 Hole Peg Test: L=39 secs, R=71 secs Motor  Motor Motor: Hemiplegia;Other (comment) Motor - Skilled Clinical Observations: Mild left hemiparesis, more affected in the LE compared to the UE. Mobility    See Function Section of chart for details  Trunk/Postural Assessment  Cervical Assessment Cervical Assessment: Within Functional Limits Thoracic Assessment Thoracic Assessment: Within Functional Limits Lumbar Assessment Lumbar Assessment: Within Functional Limits Postural Control Postural Control: Within Functional Limits  Balance Balance Balance Assessed: Yes Standardized Balance Assessment Standardized Balance Assessment: Berg Balance Test Berg Balance Test Sit to Stand: Able to stand  independently using hands Standing  Unsupported: Able to stand 2 minutes with supervision Sitting with Back Unsupported but Feet Supported on Floor or Stool: Able to sit safely and securely 2 minutes Stand to Sit: Controls descent by using hands Transfers: Able to transfer safely, definite need of hands Standing Unsupported with Eyes Closed: Able to stand 10 seconds with supervision Standing Ubsupported with Feet Together: Able to place feet together independently and stand for 1 minute with supervision From Standing, Reach Forward with Outstretched Arm: Can reach forward >12 cm safely (5") From Standing Position, Pick up Object from Floor: Able to pick up shoe, needs supervision From Standing Position, Turn to Look Behind Over each Shoulder: Needs supervision when turning Turn 360 Degrees: Able to turn 360 degrees safely but slowly Standing Unsupported, Alternately Place Feet on Step/Stool: Able to complete >2 steps/needs minimal assist (completed 8 steps but with min assist) Standing Unsupported, One Foot in Front: Loses balance while stepping or standing Standing on One Leg: Able to lift leg independently and hold > 10 seconds Total Score: 36 Static Sitting Balance Static Sitting - Level of Assistance: 6: Modified independent (Device/Increase time) Dynamic Sitting Balance Dynamic Sitting - Balance Support: During functional activity Dynamic Sitting - Level of Assistance: 5: Stand by assistance Static Standing Balance Static Standing - Balance Support: During functional activity Static Standing - Level of Assistance: 4: Min assist Dynamic Standing Balance Dynamic Standing - Balance Support: During functional activity Dynamic Standing - Level of Assistance: 4: Min assist Extremity/Trunk Assessment RUE Assessment RUE Assessment: Exceptions to [facility] RUE Strength RUE Overall Strength Comments: AROM WFLS for shoulder, elbow, and wrist,  Strength in these areas 3+/5.  decreased FM coordination from previous CVA resulting in  decreased efficiency with opposition of thumb to all digits.  Gross grasp strength 4/5 in the hand however. LUE Assessment LUE Assessment: Exceptions to [facility] LUE Strength LUE Overall Strength Comments: Pt with AROM WFLS for all joints, strenght including grip 3+/5 throughout.  Pt with decreased FM coordination but uses the LUE at a diminshed level.   See Function Navigator for Current Functional Status.   Refer to Care Plan for Long Term Goals  Recommendations for other services: None    Discharge Criteria: Patient will be discharged from OT if patient refuses treatment 3 consecutive times without medical reason, if treatment goals not met, if there is a change in medical status, if patient makes no progress towards goals or if patient is discharged from hospital.  The above  assessment, treatment plan, treatment alternatives and goals were discussed and mutually agreed upon: by patient  Joeleen Wortley OTR/L 06/18/2017, 1:38 PM

## 2017-06-19 ENCOUNTER — Inpatient Hospital Stay (HOSPITAL_COMMUNITY): Payer: Medicare Other

## 2017-06-19 ENCOUNTER — Inpatient Hospital Stay (HOSPITAL_COMMUNITY): Payer: Medicare Other | Admitting: Occupational Therapy

## 2017-06-19 ENCOUNTER — Inpatient Hospital Stay (HOSPITAL_COMMUNITY): Payer: Medicare Other | Admitting: Speech Pathology

## 2017-06-19 ENCOUNTER — Inpatient Hospital Stay (HOSPITAL_COMMUNITY): Payer: Medicare Other | Admitting: *Deleted

## 2017-06-19 ENCOUNTER — Inpatient Hospital Stay (HOSPITAL_COMMUNITY): Payer: Medicare Other | Admitting: Physical Therapy

## 2017-06-19 DIAGNOSIS — D72829 Elevated white blood cell count, unspecified: Secondary | ICD-10-CM

## 2017-06-19 NOTE — Progress Notes (Signed)
Physical Therapy Session Note  Patient Details  Name: Leslie Simmons MRN: [id number] Date of Birth: 09/15/1966  Today's Date: 06/19/2017 PT Individual Time: 0907-1000 PT Individual Time Calculation (min): 53 min   Short Term Goals: Week 1:  PT Short Term Goal 1 (Week 1): =LTGs due to ELOS  Skilled Therapeutic Interventions/Progress Updates:   Pt received standing in room on way to the recliner. PT educated pt on need to staff member to be present in order safely ambulate around room.   PT instructed pt in gait training without RW x 159f with supervision assist from PT. Min cues for posture and improved hip  Control. Gait training also instructed with Rollator 2x 2083fwith supervision assist min cues for gait pattern to reduce trendelenburg gait. Pt noted to have improved posture and step length with use of Rollator compared to no AD.   Dynamic balance training instructed by PT:  Step ladder balance activities:  2 feet ineach space x 4  Side stepping x 2 Bilaterally 1 foot in each space x 6 Min assist throughout to prevent lateral LOB with min cues for step length  Recreational Therapist present for RT evaluation.   Toe taps on 1 on 2 cones from level surface 3 x 10 Toe taps on 1 cone from airex pad 2x 10   Min assist from PT for improved trunk and pelvic control as well as min cues decreased speed to improve Neuromotor control.   Patient returned too room and left sitting in  Recliner with call bell in reach and all needs met.         Therapy Documentation Precautions:  Precautions Precautions: Fall Precaution Comments: mild Left hemiparesis Restrictions Weight Bearing Restrictions: No    Pain: Pain Assessment Pain Assessment: 0-10 Pain Score: 0-No pain   See Function Navigator for Current Functional Status.   Therapy/Group: Individual Therapy  AuLorie Phenix/02/2017, 10:01 AM

## 2017-06-19 NOTE — Progress Notes (Signed)
Physical Therapy Session Note  Patient Details  Name: Leslie Simmons MRN: [id number] Date of Birth: Jan 10, 1966  Today's Date: 06/19/2017 PT Individual Time: 0800-0900 PT Individual Time Calculation (min): 60 min   Short Term Goals: Week 1:  PT Short Term Goal 1 (Week 1): =LTGs due to ELOS  Skilled Therapeutic Interventions/Progress Updates:    Pt sitting in w/c upon PT arrival, agreeable to therapy tx and reports pain 6/10 in low back. Pt propelled w/c to the gym x 150 ft using B UEs with supervision. Pt performed 1 x 10 sit to stands and 2 x 10 lateral step ups on each leg for strengthening and NMR. Seated edge of mat pt performed 2 x 10 LAQ bilaterally with 4lb cuff weight for NMR and strengthening. Pt transferred from sitting<>supine x 2 with education on technique for sit<>sidely<>supine to help minimize LBP, with supervision. In supine pt educated on TrA activation during core exercises. Pt performed 2 x 10 marches with feet off the mat and 2 x 10 alternating UE/LE dead bug exercise for core strengthening. Pt ambulated x 150 ft and x 250 ft without AD, min assist and verbal cues for step length and gait speed. Pt left seated in recliner at end of session with needs in reach.   Therapy Documentation Precautions:  Precautions Precautions: Fall Precaution Comments: mild Left hemiparesis Restrictions Weight Bearing Restrictions: No   See Function Navigator for Current Functional Status.   Therapy/Group: Individual Therapy  Netta Corrigan, PT, DPT 06/19/2017, 7:55 AM

## 2017-06-19 NOTE — Evaluation (Signed)
Recreational Therapy Assessment and Plan  Patient Details  Name: Leslie Simmons MRN: [id number] Date of Birth: 1966/05/16 Today's Date: 06/19/2017  Rehab Potential: Good ELOS: 5 days   Assessment  Problem List:      Patient Active Problem List   Diagnosis Date Noted  . Lumbar radiculopathy   . Chronic bilateral low back pain with sciatica   . Benign essential HTN   . Seizures ([place])   . Primary insomnia   . Prediabetes   . 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 Constipation 06/16/2017  . History of stroke   . Hyperlipidemia   . Acute ischemic stroke ([place]) 06/13/2017  . Lumbar spinal stenosis 06/13/2017  . Hypertensive urgency 06/13/2017  . Essential hypertension 06/13/2017  . History of hemorrhagic stroke with residual hemiparesis ([place]) 06/13/2017  . History of seizure 06/13/2017  . Left leg weakness 06/13/2017    Past Medical History:      Past Medical History:  Diagnosis Date  . Hypertension   . Stroke [facility])    Past Surgical History: History reviewed. No pertinent surgical history.  Assessment & Plan Clinical Impression: Patient is a 51 y.o. year old female with history fo HTN, hemorrhagic stroke with residual right sided weakness/numbnessand seizure who was admitted on 06/13/17 with reports of back pain and LLE weakness since the night before. MRI brain done revealing acute nonhemorrhagic infarct involving lateral right midbrain at right corticospinal tract and Remote L-MCA and basal ganglia infarcts.MRI lumbar spine done revealing L4 onL5 anterolisthesis with severe canal and bilateral subarticular stenosis and left subarticular Disc protrusion L5-S1 with impingement of S1 nerve. Work up ongoing and NS consulted for input on stenosis. Dr. Ronnald Ramp recommended therapy and elected decompression in the future. Therapy evaluations initiated revealing deficits in gait, ability to ADLs, lability and mild dysarthria with mild  deficits in working memory. CIR recommended by rehab team Patient transferred to CIR on 06/17/2017.  Pt presents with decreased activity tolerance, decreased functional mobility, decreased balance Limiting pt's independence with leisure/community pursuits.  Leisure History/Participation Premorbid leisure interest/current participation: Medical laboratory scientific officer - Building control surveyor - Doctor, hospital - Travel (Comment);Sports - Exercise (Comment) Expression Interests: Music (Comment) Other Leisure Interests: Television;Movies;Cooking/Baking;Housework Leisure Participation Style: With Family/Friends Awareness of Community Resources: Excellent Psychosocial / Spiritual Social interaction - Mood/Behavior: Cooperative Academic librarian Appropriate for Education?: Yes Patient Agreeable to Outing?: Yes Recreational Therapy Orientation Orientation -Reviewed with patient: Available activity resources Strengths/Weaknesses Patient Strengths/Abilities: Willingness to participate;Active premorbidly Patient weaknesses: Physical limitations TR Patient demonstrates impairments in the following area(s): Endurance;Motor;Pain  Plan Rec Therapy Plan Is patient appropriate for Therapeutic Recreation?: Yes Rehab Potential: Good Treatment times per week: Min 1 TR session for community reintegration Estimated Length of Stay: 5 days TR Treatment/Interventions: Adaptive equipment instruction;1:1 session;Functional mobility training;Community reintegration  Recommendations for other services: None   Discharge Criteria: Patient will be discharged from TR if patient refuses treatment 3 consecutive times without medical reason.  If treatment goals not met, if there is a change in medical status, if patient makes no progress towards goals or if patient is discharged from hospital.  The above assessment, treatment plan, treatment alternatives and goals were discussed and mutually agreed upon: by  patient  [place] 06/19/2017, 1:54 PM

## 2017-06-19 NOTE — Progress Notes (Signed)
Leslie Simmons PHYSICAL MEDICINE & REHABILITATION     PROGRESS NOTE  Subjective/Complaints:  Pt seen sitting up in her chair this AM.  She states she slept well overnight.  She notes she did well with therapies, but had some difficulty ambulating.  She states she is looking forward to going home.   ROS: Denies CP, SOB, N/V/D.  Objective: Vital Signs: Blood pressure 132/70, pulse 80, temperature 98.6 F (37 C), temperature source Oral, resp. rate 17, height 5' (1.524 m), weight 79.3 kg (174 lb 14.4 oz), SpO2 98 %. No results found.  Recent Labs  06/18/17 1106  WBC 11.3*  HGB 12.4  HCT 38.9  PLT 306    Recent Labs  06/18/17 1106  NA 138  K 4.2  CL 107  GLUCOSE 129*  BUN 15  CREATININE 0.72  CALCIUM 9.9   CBG (last 3)   Recent Labs  06/16/17 1225  GLUCAP 146*    Wt Readings from Last 3 Encounters:  06/17/17 79.3 kg (174 lb 14.4 oz)  06/13/17 80.5 kg (177 lb 7.5 oz)    Physical Exam:  BP 132/70 (BP Location: Left Arm)   Pulse 80   Temp 98.6 F (37 C) (Oral)   Resp 17   Ht 5' (1.524 m)   Wt 79.3 kg (174 lb 14.4 oz)   SpO2 98%   BMI 34.16 kg/m  Constitutional: She appears well-developedand well-nourished. No distress.  HENT: Normocephalicand atraumatic.  Eyes: EOMare normal. No discharge.  Cardiovascular: RRR. No JVD. Respiratory: Effort normal and breath sounds normal.  GI: Bowel sounds are normal. She exhibits no distension. Musculoskeletal: She exhibits no edemaor tenderness.  Neurological: She is alertand oriented.  Able to follow basic commands.  Has poor insight into deficits.  LUE: 4/5 proximal to distal (stable) RUE: 4+/5 proximal to distal LLE: 4-/5 proximally, 4/5 distally RLE: 4-/5 proximally, 4/5 distally Skin: Skin is warmand dry. She is not diaphoretic.  Psychiatric: She has a normal mood and affect. She is slowed. She expresses inappropriate judgment.    Assessment/Plan: 1. Functional deficits secondary to right midbrain  infarct, lumbosacral spondylosis with radiculopathy which require 3+ hours per day of interdisciplinary therapy in a comprehensive inpatient rehab setting. Physiatrist is providing close team supervision and 24 hour management of active medical problems listed below. Physiatrist and rehab team continue to assess barriers to discharge/monitor patient progress toward functional and medical goals.  Function:  Bathing Bathing position   Position: Shower  Bathing parts Body parts bathed by patient: Right arm, Left arm, Chest, Abdomen, Front perineal area, Buttocks, Right upper leg, Left upper leg, Right lower leg, Left lower leg Body parts bathed by helper: Back  Bathing assist Assist Level: Touching or steadying assistance(Pt > 75%)      Upper Body Dressing/Undressing Upper body dressing   What is the patient wearing?: Bra, Pull over shirt/dress Bra - Perfomed by patient: Thread/unthread right bra strap, Thread/unthread left bra strap, Hook/unhook bra (pull down sports bra)   Pull over shirt/dress - Perfomed by patient: Thread/unthread right sleeve, Thread/unthread left sleeve, Put head through opening, Pull shirt over trunk          Upper body assist Assist Level: Supervision or verbal cues      Lower Body Dressing/Undressing Lower body dressing   What is the patient wearing?: Underwear, Pants, Socks, Shoes Underwear - Performed by patient: Thread/unthread right underwear leg, Thread/unthread left underwear leg, Pull underwear up/down   Pants- Performed by patient: Thread/unthread right pants leg,  Thread/unthread left pants leg, Pull pants up/down       Socks - Performed by patient: Don/doff right sock, Don/doff left sock   Shoes - Performed by patient: Don/doff right shoe, Don/doff left shoe, Fasten right, Fasten left            Lower body assist Assist for lower body dressing: Touching or steadying assistance (Pt > 75%)      Toileting Toileting   Toileting steps  completed by patient: Adjust clothing prior to toileting, Performs perineal hygiene, Adjust clothing after toileting Toileting steps completed by helper: Performs perineal hygiene Toileting Assistive Devices: Grab bar or rail  Toileting assist Assist level: Supervision or verbal cues   Transfers Chair/bed transfer   Chair/bed transfer method: Stand pivot Chair/bed transfer assist level: Touching or steadying assistance (Pt > 75%) Chair/bed transfer assistive device: Armrests     Locomotion Ambulation     Max distance: 150 ft Assist level: Touching or steadying assistance (Pt > 75%)   Wheelchair   Type: Manual Max wheelchair distance: 150 ft Assist Level: Supervision or verbal cues  Cognition Comprehension Comprehension assist level: Follows complex conversation/direction with no assist  Expression Expression assist level: Expresses complex ideas: With no assist  Social Interaction Social Interaction assist level: Interacts appropriately with others - No medications needed.  Problem Solving Problem solving assist level: Solves complex problems: Recognizes & self-corrects  Memory Memory assist level: Complete Independence: No helper     Medical Problem List and Plan: 1. Left sided weakness and functional deficitssecondary to right midbrain infarct, lumbosacral spondylosis with radiculopathy  Cont CIR 2. DVT Prophylaxis/Anticoagulation: Pharmaceutical: Lovenox 3. Chronic back pain/Pain Management: Hydrocodone effective.  4. Mood: LCSW to follow for evaluate and support.  5. Neuropsych: This patient is not fully capable of making decisions on herown behalf. 6. Skin/Wound Care: routine pressure relief measures.  7. Fluids/Electrolytes/Nutrition: Monitor I/Os.   BMP within acceptable range on 9/4 8. HTN: Monitor BP bid.   Controlled 9/5 9. Dyslipidemia: On lipitor.  10. Seizure disorder: On lamictal.  11. H/o recurrent strokes: Hypercoagulopathy panel negative.  12.  Chronic insomnia: uses ambien at home.  13. Constipation: resolved with miralax.  14. Prediabetes  Cont to monitor 15. Leukocytosis  Afebrile  Cont to monitor   LOS (Days) 2 A FACE TO FACE EVALUATION WAS PERFORMED  Ankit Lorie Phenix 06/19/2017 7:44 AM

## 2017-06-19 NOTE — Progress Notes (Signed)
Occupational Therapy Session Note  Patient Details  Name: Leslie Simmons MRN: [id number] Date of Birth: [birth year]  Today's Date: 06/19/2017 OT Individual Time: 7494-4967 OT Individual Time Calculation (min): 31 min    Short Term Goals: Week 1:  OT Short Term Goal 1 (Week 1): STGs equal to LTGs based on ELOS  Skilled Therapeutic Interventions/Progress Updates:    Pt completed functional mobility to the therapy gym with min assist, no assistive device.  Once in the gym worked on left hip and knee strengthening in standing.  Had pt place RLE on step while standing on the LLE.  Min assist at times to maintain control as she would shift from slight flexion to slight hyper extension.  Min assist also needed to maintain hip extension as well, which was difficult secondary to weakness and increased lower back pain.  Pt rated pain at 7/10.  Also had pt stand and squat to pick up cone on the left side using the LUE.  Worked on small time intervals of holding squat position while reaching as well.  Ambulated back to room with min assist to conclude session.  Pt's significant other in room, discussed need for shower seat as well as placement of grab bar in the shower.  He plans to purchase shower seat on his own at this time.  Pt left in bedside chair with her significant other present to conclude session.    Therapy Documentation Precautions:  Precautions Precautions: Fall Precaution Comments: mild Left hemiparesis Restrictions Weight Bearing Restrictions: No  Pain: Pain Assessment Pain Assessment: No/denies pain ADL:  See Function Navigator for Current Functional Status.   Therapy/Group: Individual Therapy  Ixchel Duck OTR/L 06/19/2017, 4:04 PM

## 2017-06-19 NOTE — Progress Notes (Signed)
Occupational Therapy Session Note  Patient Details  Name: Berda Shelvin MRN: [id number] Date of Birth: [birth year]  Today's Date: 06/19/2017 OT Individual Time: 1001-1057 OT Individual Time Calculation (min): 56 min    Short Term Goals: Week 1:  OT Short Term Goal 1 (Week 1): STGs equal to LTGs based on ELOS  Skilled Therapeutic Interventions/Progress Updates:    Pt completed shower and dressing to start session.  She was able to perform transfer to the shower and around the room without assistive device during session and min guard assist.  She was able to complete all bathing sit to stand on the shower bench with supervision.  She transferred out to the bedside chair for dressing tasks where she stood to donn her bra, with increased time secondary to getting it twisted on the first attempt, and also donned her shirt.  Underpants and pants were donned sit to stand with close supervision as well.  Next had pt ambulate to the ADL apartment with use of the rollator.  Educated her on use of the rollator for gathering items in the kitchen for simple meal prep.  She was able to complete gathering items and maneuvering them around the kitchen with close supervision.  Min instructional cueing for walker positioning and for locking the brakes during task.  Also had pt perform simulated walk-in shower transfer using the rollator to get to the door of the shower and then stepping in forward with use of the front wall of the shower for stability.  Discussed need for a shower seat as well and pt reports having a hand held shower already.  Returned to room at end of session with pt left sitting in bedside recliner with call button and phone in reach.    Therapy Documentation Precautions:  Precautions Precautions: Fall Precaution Comments: mild Left hemiparesis Restrictions Weight Bearing Restrictions: No   Pain: Pain Assessment Pain Assessment: No/denies pain ADL: See Function Navigator for Current  Functional Status.   Therapy/Group: Individual Therapy  Nolyn Eilert OTR/L 06/19/2017, 12:41 PM

## 2017-06-20 ENCOUNTER — Inpatient Hospital Stay (HOSPITAL_COMMUNITY): Payer: Medicare Other | Admitting: Physical Therapy

## 2017-06-20 ENCOUNTER — Encounter (HOSPITAL_COMMUNITY): Payer: Medicare Other | Admitting: *Deleted

## 2017-06-20 ENCOUNTER — Inpatient Hospital Stay (HOSPITAL_COMMUNITY): Payer: Medicare Other | Admitting: Occupational Therapy

## 2017-06-20 MED ORDER — POLYETHYLENE GLYCOL 3350 17 G PO PACK
17.0000 g | PACK | Freq: Three times a day (TID) | ORAL | Status: DC
  Administered 2017-06-20 – 2017-06-22 (×4): 17 g via ORAL
  Filled 2017-06-20 (×8): qty 1

## 2017-06-20 MED ORDER — MAGNESIUM HYDROXIDE 400 MG/5ML PO SUSP
30.0000 mL | Freq: Every day | ORAL | Status: DC | PRN
  Administered 2017-06-20: 30 mL via ORAL
  Filled 2017-06-20: qty 30

## 2017-06-20 NOTE — Progress Notes (Signed)
Occupational Therapy Session Note  Patient Details  Name: Leslie Simmons MRN: [id number] Date of Birth: [birth year]  Today's Date: 06/20/2017 OT Individual Time: 1300-1310 OT Individual Time Calculation (min): 10 min    Short Term Goals: Week 1:  OT Short Term Goal 1 (Week 1): STGs equal to LTGs based on ELOS  Skilled Therapeutic Interventions/Progress Updates:    Pt greeted laying supine in bed stating she had severe stomach ache. Pt reports she just returned from the bathroom and is very uncomfortable. Educated pt on importance of moving to try to get bowels moving. OT also discussed OT goals and plan of care with pt. Pt reported understanding but declined any activity at this time. OT provided emotional support and left pt supine in bed with needs met.   Therapy Documentation Precautions:  Precautions Precautions: Fall Precaution Comments: mild Left hemiparesis Restrictions Weight Bearing Restrictions: No General: General OT Amount of Missed Time: 50 Minutes Pain: Pain Assessment Pain Assessment: 0-10 Pain Score: 5  Pain Type: Acute pain Pain Location: Abdomen Pain Orientation: Lower Pain Descriptors / Indicators: Aching Pain Onset: On-going Pain Intervention(s): Emotional support  See Function Navigator for Current Functional Status.   Therapy/Group: Individual Therapy  Valma Cava 06/20/2017, 1:50 PM

## 2017-06-20 NOTE — Progress Notes (Signed)
Social Work Patient ID: Leslie Simmons, female   DOB: 1965/11/23, 51 y.o.   MRN: [id number]   Leslie Simmons, Leslie Legacy, LCSW Social Worker Signed   Patient Care Conference Date of Service: 06/20/2017 11:18 AM      Hide copied text Hover for attribution information Inpatient RehabilitationTeam Conference and Plan of Care Update Date: 06/19/2017   Time: 2:00 PM      Patient Name: Leslie Simmons      Medical Record Number: [id number]  Date of Birth: 1966-03-09 Sex: Female         Room/Bed: 4M02C/4M02C-01 Payor Info: Payor: MEDICARE / Plan: MEDICARE PART A AND B / Product Type: *No Product type* /     Admitting Diagnosis: Right CVA  Admit Date/Time:  06/17/2017  1:49 PM Admission Comments: No comment available    Primary Diagnosis:  Stroke due to embolism of right posterior cerebral artery ([place]) Principal Problem: Stroke due to embolism of right posterior cerebral artery [name]       Patient Active Problem List    Diagnosis Date Noted  . Leukocytosis    . Lumbar radiculopathy    . Chronic bilateral low back pain with sciatica    . Benign essential HTN    . Seizures ([place])    . Primary insomnia    . Prediabetes    . Stroke due to embolism of right posterior cerebral artery ([place]) 06/17/2017  . Constipation 06/16/2017  . History of stroke    . Hyperlipidemia    . Acute ischemic stroke ([place]) 06/13/2017  . Lumbar spinal stenosis 06/13/2017  . Hypertensive urgency 06/13/2017  . Essential hypertension 06/13/2017  . History of hemorrhagic stroke with residual hemiparesis ([place]) 06/13/2017  . History of seizure 06/13/2017  . Left leg weakness 06/13/2017      Expected Discharge Date: Expected Discharge Date: 06/24/17   Team Members Present: Physician leading conference: Dr. Delice Lesch Social Worker Present: Alfonse Alpers, LCSW Nurse Present: Leonette Nutting, RN PT Present: Michaelene Song, PT OT Present: Clyda Greener, OT SLP Present: Windell Moulding, SLP PPS Coordinator present : Daiva Nakayama, RN, CRRN       Current Status/Progress Goal Weekly Team Focus  Medical     Left sided weakness and functional deficits secondary to right midbrain infarct, lumbosacral spondylosis with radiculopathy  Improve mobility, safety, leukocytosis  See above   Bowel/Bladder     Patient is continent of B/B with medication (pt does not like the taste of Miralax and refused night dose on 06/18/17).  Last BM 06/17/17.  Requires supervision only.  Patient will remain continent of B/B with mod I assist.  Assess and monitor B/B Qshift and PRN.   Swallow/Nutrition/ Hydration               ADL's     supervision for UB bathing and dressing, min guard assist for LB bathing and dressing sit to stand.  Min assist for shower and toilet transfers without use of an assistive device.  modified independent overall  selfcare retraining, transfer training, balance retraining, pt/family education, therapeutic exercise, DME education   Mobility     min assist for transfers and ambulation up to 150 ft, min assist for 12 steps using B handrails, supervision w/c propulsion  Mod I for short distance ambulation, transfers and bed mobility. Supervision car transfers and community ambulation  L LE NMR, transfer training, gait training, L LE strenghtening, d/c planning, balance re training, endurance   Communication  Safety/Cognition/ Behavioral Observations             Pain     Patient complains of chronic back pain from herniated disc rated a 7/10.  Requests Norco 1 tab twice a day.  Refused Voltaren gel stating ineffective.  Patient will maintain pain control of 4/10 or less.  Assess pain Qshift and PRN.   Skin     Rash and bruising on the backside of both thighs.  Maintain skin integrity.  Assess skin Qshift and PRN.     Rehab Goals Patient on target to meet rehab goals: Yes Rehab Goals Revised: none - pt's first conference *See Care Plan and progress notes for long and short-term goals.      Barriers to Discharge    Current Status/Progress Possible Resolutions Date Resolved   Physician     Medical stability;Lack of/limited family support     See above  Therapies, follow labs, follow BP      Nursing   Home environment access/layout;Behavior;Medical stability  Inconsistent history/background from patient, family, file; where pt is going to live after discharge and who is going to provide any necessary care           PT  Decreased caregiver support                 OT Decreased caregiver support               SLP            SW              Discharge Planning/Teaching Needs:  Pt plans to return to her significant other's Juanda Crumble) home at d/c.  He works until 2:30pm, but her goals are mod I.  Pt will be mod I at d/c.  Pt stated that Juanda Crumble can come over the weekend to go through therapies, if needed.   Team Discussion:  Pt is stable and Dr. Posey Pronto will continue to watch her blood pressure.  Pt's pain is well managed and she is continent of bowel and bladder.  Pt walks around room without staff, likely because she forgets to call for help.  Pt with memory deficits from previous stroke.  ST is recommending neuropsychology visit due to pt's grief/potential depression about loss of function/ability to be more involved in community.  Pt min guard to shower, tx with mod I goals.  Pt is doing PT tasks at supervision with lots of walking.  Can climb 12 steps and has mod I PT goals, as well.  Revisions to Treatment Plan:  none    Continued Need for Acute Rehabilitation Level of Care: The patient requires daily medical management by a physician with specialized training in physical medicine and rehabilitation for the following conditions: Daily direction of a multidisciplinary physical rehabilitation program to ensure safe treatment while eliciting the highest outcome that is of practical value to the patient.: Yes Daily medical management of patient stability for increased activity during participation in an  intensive rehabilitation regime.: Yes Daily analysis of laboratory values and/or radiology reports with any subsequent need for medication adjustment of medical intervention for : Neurological problems   Leahmarie Gasiorowski, Silvestre Mesi 06/20/2017, 11:18 AM

## 2017-06-20 NOTE — Progress Notes (Signed)
Physical Therapy Session Note  Patient Details  Name: Addalyne Vandehei MRN: [id number] Date of Birth: Aug 01, 1966  Today's Date: 06/20/2017 PT Individual Time: 4699-7802 PT Individual Time Calculation (min): 30 min  PT Individual Missed Time (min): 30 min  Short Term Goals: Week 1:  PT Short Term Goal 1 (Week 1): =LTGs due to ELOS  Skilled Therapeutic Interventions/Progress Updates:   Pt seated EOB upon arrival and agreeable to therapy, c/o stomach pain 2/2 bowel constipation. She agreed to ambulate w/ hopes of relieving pain. Ambulated 250' w/ supervision using rollator. Increased L knee ms weakness towards end of walk w/ mild knee buckling. Returned to EOB and pt c/o dizziness, vitals WNL at this time. Pt requesting to end therapy early. Assisted pt in changing to more comfortable clothes w/ set-up assist to work on functional sitting/standing balance. Ended session supine in bed, call bell within reach and all needs met. RN made aware of status.   Therapy Documentation Precautions:  Precautions Precautions: Fall Precaution Comments: mild Left hemiparesis Restrictions Weight Bearing Restrictions: No Pain: Pain Assessment Pain Assessment: 0-10 Pain Score: 5  Pain Type: Acute pain Pain Location: Abdomen Pain Orientation: Lower Pain Descriptors / Indicators: Aching Pain Onset: On-going Pain Intervention(s): Emotional support  See Function Navigator for Current Functional Status.   Therapy/Group: Individual Therapy  Aliannah Holstrom K Arnette 06/20/2017, 2:49 PM

## 2017-06-20 NOTE — Progress Notes (Signed)
Physical Therapy Session Note  Patient Details  Name: Leslie Simmons MRN: [id number] Date of Birth: 15-Feb-1966  Today's Date: 06/20/2017 PT Individual Time: 1000-1130 PT Individual Time Calculation (min): 90 min   Short Term Goals: Week 1:  PT Short Term Goal 1 (Week 1): =LTGs due to ELOS  Skilled Therapeutic Interventions/Progress Updates:    Pt participated in community reintegration/outing to [employer] at overall supervision ambulatory level using rollator.  Goals focused on safe community mobility, identification & negotiation of obstacles, accessing public restroom, energy conservation techniques/education & self advocacy.  See outing goal sheet in shadow chart for full details.     Therapy Documentation Precautions:  Precautions Precautions: Fall Precaution Comments: mild Left hemiparesis Restrictions Weight Bearing Restrictions: No Pain: Pain Assessment Pain Score: 0-No pain   See Function Navigator for Current Functional Status.   Therapy/Group: Community outting  Lorie Phenix 06/20/2017, 12:46 PM

## 2017-06-20 NOTE — Patient Care Conference (Signed)
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 Leukocytosis   . Lumbar radiculopathy   . Chronic bilateral low back pain with sciatica   . Benign essential HTN   . Seizures ([place])   . Primary insomnia   . Prediabetes   . 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 Constipation 06/16/2017  . History of stroke   . Hyperlipidemia   . Acute ischemic stroke ([place]) 06/13/2017  . Lumbar spinal stenosis 06/13/2017  . Hypertensive urgency 06/13/2017  . Essential hypertension 06/13/2017  . History of hemorrhagic stroke with residual hemiparesis ([place]) 06/13/2017  . History of seizure 06/13/2017  . Left leg weakness 06/13/2017    Expected Discharge Date: Expected Discharge Date: 06/24/17  Team Members Present: Physician leading conference: Dr. Delice Lesch Social Worker Present: Alfonse Alpers, LCSW Nurse Present: Leonette Nutting, RN PT Present: Michaelene Song, PT OT Present: Clyda Greener, OT SLP Present: Windell Moulding, SLP PPS Coordinator present : Daiva Nakayama, RN, CRRN     Current Status/Progress Goal Weekly Team Focus  Medical   Left sided weakness and functional deficits secondary to right midbrain infarct, lumbosacral spondylosis with radiculopathy  Improve mobility, safety, leukocytosis  See above   Bowel/Bladder   Patient is continent  of B/B with medication (pt does not like the taste of Miralax and refused night dose on 06/18/17).  Last BM 06/17/17.  Requires supervision only.  Patient will remain continent of B/B with mod I assist.  Assess and monitor B/B Qshift and PRN.   Swallow/Nutrition/ Hydration             ADL's   supervision for UB bathing and dressing, min guard assist for LB bathing and dressing sit to stand.  Min assist for shower and toilet transfers without use of an assistive device.  modified independent overall  selfcare retraining, transfer training, balance retraining, pt/family education, therapeutic exercise, DME education   Mobility   min assist for transfers and ambulation up to 150 ft, min assist for 12 steps using B handrails, supervision w/c propulsion  Mod I for short distance ambulation, transfers and bed mobility. Supervision car transfers and community ambulation  L LE NMR, transfer training, gait training, L LE strenghtening, d/c planning, balance re training, endurance   Communication             Safety/Cognition/ Behavioral Observations            Pain   Patient complains of chronic back pain from herniated disc rated a 7/10.  Requests Norco 1 tab twice a day.  Refused Voltaren gel stating ineffective.  Patient will maintain pain control of 4/10 or less.  Assess pain Qshift and PRN.   Skin   Rash and bruising on the backside of both thighs.  Maintain skin integrity.  Assess skin Qshift and PRN.    Rehab Goals Patient on target to meet rehab goals: Yes Rehab Goals Revised: none - pt's first conference *See Care Plan and progress notes for long and short-term goals.     Barriers to Discharge  Current Status/Progress Possible Resolutions Date Resolved   Physician    Medical stability;Lack of/limited family support     See above  Therapies, follow labs, follow BP      Nursing  Home environment access/layout;Behavior;Medical stability  Inconsistent history/background from patient, family,  file; where pt is going to live after discharge and who is going to provide any necessary care            PT  Decreased caregiver support                 OT Decreased caregiver support                SLP                SW                Discharge Planning/Teaching Needs:  Pt plans to return to her significant other's Juanda Crumble) home at d/c.  He works until 2:30pm, but her goals are mod I.  Pt will be mod I at d/c.  Pt stated that Juanda Crumble can come over the weekend to go through therapies, if needed.   Team Discussion:  Pt is stable and Dr. Posey Pronto will continue to watch her blood pressure.  Pt's pain is well managed and she is continent of bowel and bladder.  Pt walks around room without staff, likely because she forgets to call for help.  Pt with memory deficits from previous stroke.  ST is recommending neuropsychology visit due to pt's grief/potential depression about loss of function/ability to be more involved in community.  Pt min guard to shower, tx with mod I goals.  Pt is doing PT tasks at supervision with lots of walking.  Can climb 12 steps and has mod I PT goals, as well.  Revisions to Treatment Plan:  none    Continued Need for Acute Rehabilitation Level of Care: The patient requires daily medical management by a physician with specialized training in physical medicine and rehabilitation for the following conditions: Daily direction of a multidisciplinary physical rehabilitation program to ensure safe treatment while eliciting the highest outcome that is of practical value to the patient.: Yes Daily medical management of patient stability for increased activity during participation in an intensive rehabilitation regime.: Yes Daily analysis of laboratory values and/or radiology reports with any subsequent need for medication adjustment of medical intervention for : Neurological problems  Silvio Sausedo, Silvestre Mesi 06/20/2017, 11:18 AM

## 2017-06-20 NOTE — Progress Notes (Signed)
Inpatient [facility] Individual Statement of Services  Patient Name:  Leslie Simmons  Date:  06/20/2017  Welcome to the [facility].  Our goal is to provide you with an individualized program based on your diagnosis and situation, designed to meet your specific needs.  With this comprehensive rehabilitation program, you will be expected to participate in at least 3 hours of rehabilitation therapies Monday-Friday, with modified therapy programming on the weekends.  Your rehabilitation program will include the following services:  Physical Therapy (PT), Occupational Therapy (OT), 24 hour per day rehabilitation nursing, Therapeutic Recreaction (TR), Neuropsychology, Case Management (Social Worker), Rehabilitation Medicine, Nutrition Services and Pharmacy Services  Weekly team conferences will be held on Wednesdays to discuss your progress.  Your Social Worker will talk with you frequently to get your input and to update you on team discussions.  Team conferences with you and your family in attendance may also be held.  Expected length of stay:  7 to 10 days  Overall anticipated outcome:  Modified independent  Depending on your progress and recovery, your program may change. Your Social Worker will coordinate services and will keep you informed of any changes. Your Social Worker's name and contact numbers are listed  below.  The following services may also be recommended but are not provided by the [facility] will be made to provide these services after discharge if needed.  Arrangements include referral to agencies that provide these services.  Your insurance has been verified to be:  Medicare and Medicaid of [place] Your primary doctor is:  You do not have a local physician, but you want one.  Sonia Baller will give you resources.  Pertinent  information will be shared with your doctor and your insurance company.  Social Worker:  Alfonse Alpers, LCSW  [phone] or [phone]  Information discussed with and copy given to patient by: Trey Sailors, 06/20/2017, 1:56 PM

## 2017-06-20 NOTE — Progress Notes (Signed)
Social Work Patient ID: Leslie Simmons, female   DOB: [birth year], 51 y.o.   MRN: [id number]   CSW met with pt and she called her dtr on speaker so that she could hear team conference update.  Pt with mod I goals and targeted d/c date of 06-24-17.  Pt stated that her significant other could probably come and go through therapies with her this weekend so that he can see how pt is doing.  Dtr has questions about changing pt's SSD and Medicaid to [place] and asked CSW to direct her with this.  Pt is stating that she hopes to go back to [place] soon, so CSW questioned whether pt should go through all of this to just then have to undo it if she returns to [place].  She will discuss this further with her dtr.  Asked CSW to communicate with her dtr since she forgets information and dtr agreed to this plan.  CSW will continue to follow and assist as needed.

## 2017-06-20 NOTE — Progress Notes (Signed)
Recreational Therapy Session Note  Patient Details  Name: Aleen Marston MRN: [id number] Date of Birth: [birth year] Today's Date: 06/20/2017  Pain: no c/o Skilled Therapeutic Interventions/Progress Updates: Pt participated in community reintegration/outing to [employer] at [employer] supervision mbulatory level using rollator.  Goals focused on safe community mobility, identification & negotiation of obstacles, accessing public restroom, energy conservation techniques/education & self advocacy.  See outing goal sheet in shadow chart for full details.   Recreational Therapy Discharge Summary Patient Details  Name: Wally Behan MRN: [id number] Date of Birth: 1966-03-29 Today's Date: 06/20/2017  Comments on progress toward goals: Pt has made great progress during LOS and is ready for discharge home at Mod I ambulatory level.  TR session focused on community reintegration at ambulatory level using rollator.  Education provided on energy conservations, self advacacy overall safety with mobility during community pursuits.  Reasons for discharge: discharge from hospital  Patient/family agrees with progress made and goals achieved: Yes    Suvan Stcyr 06/20/2017, 8:53 AM

## 2017-06-20 NOTE — Progress Notes (Signed)
Social Work Assessment and Plan  Patient Details  Name: Leslie Simmons MRN: [id number] Date of Birth: 1966-06-09  Today's Date: 06/19/2017  Problem List:  Patient Active Problem List   Diagnosis Date Noted  . Leukocytosis   . Lumbar radiculopathy   . Chronic bilateral low back pain with sciatica   . Benign essential HTN   . Seizures ([place])   . Primary insomnia   . Prediabetes   . 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 Constipation 06/16/2017  . History of stroke   . Hyperlipidemia   . Acute ischemic stroke ([place]) 06/13/2017  . Lumbar spinal stenosis 06/13/2017  . Hypertensive urgency 06/13/2017  . Essential hypertension 06/13/2017  . History of hemorrhagic stroke with residual hemiparesis ([place]) 06/13/2017  . History of seizure 06/13/2017  . Left leg weakness 06/13/2017   Past Medical History:  Past Medical History:  Diagnosis Date  . Hypertension   . Stroke [facility])    Past Surgical History: History reviewed. No pertinent surgical history. Social History:  reports that she has never smoked. She has never used smokeless tobacco. She reports that she does not drink alcohol or use drugs.  Family / Support Systems Marital Status: Single Patient Roles: Partner, Parent, Other (Comment) (grandmother) Spouse/Significant Other: Geralyn Corwin - significant other - [phone] Children: Roxy Horseman - dtr - [phone] Anticipated Caregiver: Juanda Crumble Ability/Limitations of Caregiver: works daily - gets off at [employer] PM Caregiver Availability: Evenings only Family Dynamics: supportive dtr and S.O.  Social History Preferred language: English Religion:  Cultural Background: Lives in [place], was here visiting S.O. and dtr. Read: Yes Write: Yes Employment Status: Disabled Date Retired/Disabled/Unemployed: 2017 Legal History/Current Legal Issues: none reported Guardian/Conservator: Pt's dtr is pt's POA and assists with finances.    Abuse/Neglect Physical Abuse: Denies Verbal Abuse: Denies Sexual Abuse: Denies Exploitation of patient/patient's resources: Denies Self-Neglect: Denies  Emotional Status Pt's affect, behavior and adjustment status: Pt reported to CSW that she is feeling better emotionally after this stroke than the previous one.  She is encouraged by her recovery this time, so feels she is doing okay emotionally. Recent Psychosocial Issues: Pt with multiple strokes and seizures.  Loss of function has been difficult for pt. Psychiatric History: none reported Substance Abuse History: none reported  Patient / Family Perceptions, Expectations & Goals Pt/Family understanding of illness & functional limitations: Pt/dtr have a good understanding of pt's condition and limitations. Premorbid pt/family roles/activities: Pt likes to spend time with family and friends and to be out and about in the community. Anticipated changes in roles/activities/participation: Pt would like to return to the above as soon as she is able. Pt/family expectations/goals: Pt wants to recover so she can have her back surgery and get back to [place].  Community Resources [employer]: None Premorbid Home Care/DME Agencies: None Transportation available at discharge: [employer] referrals recommended: Neuropsychology, Support group (specify) (stroke support group)  Discharge Planning Living Arrangements: Spouse/significant other Support Systems: Spouse/significant other, Children, Other relatives, Friends/neighbors Type of Residence: Private residence Insurance Resources: [employer], [employer] (specify county) ([place]) Museum/gallery curator Resources: [employer] Screen Referred: No Living Expenses: Lives with family Money Management: Family (dtr is pt's POA) Does the patient have any problems obtaining your medications?: No Home Management: Pt's S.O. can assist with this. Patient/Family Preliminary Plans: Pt to return to S.O.'s  home until she has recovered and then she plans to return to [place]. Social Work Anticipated Follow Up Needs: HH/OP, Support Group Expected  length of stay: 7 to 10 days  Clinical Impression CSW met with pt and pt's dtr was on speaker phone to introduce self and role of CSW, as well as to complete assessment.  Pt and dtr are pleased that she could come to [facility].  Pt is making good progress and is set for d/c on 06-24-17 with modified independent goals.  Pt is deciding between [facility] and Outpt therapy.  Dtr and pt are also trying to decide if she is staying in [place] or not, as she will have to switch over all of her documents/services.  Pt will need some DME and f/u therapies and PCP.  CSW to assist with this.  Gaia Gullikson, Silvestre Mesi 06/20/2017, 2:24 PM

## 2017-06-20 NOTE — Progress Notes (Signed)
[facility] PHYSICAL MEDICINE & REHABILITATION     PROGRESS NOTE  Subjective/Complaints:  Pt seen sitting up at EOB this AM.  She slept well overnight.  She is looking forward to going on an outing today.    ROS: Denies CP, SOB, N/V/D.  Objective: Vital Signs: Blood pressure (!) 146/97, pulse 94, temperature 97.8 F (36.6 C), temperature source Oral, resp. rate 18, height 5' (1.524 m), weight 79.3 kg (174 lb 14.4 oz), SpO2 100 %. No results found.  Recent Labs  06/18/17 1106  WBC 11.3*  HGB 12.4  HCT 38.9  PLT 306    Recent Labs  06/18/17 1106  NA 138  K 4.2  CL 107  GLUCOSE 129*  BUN 15  CREATININE 0.72  CALCIUM 9.9   CBG (last 3)  No results for input(s): GLUCAP in the last 72 hours.  Wt Readings from Last 3 Encounters:  06/17/17 79.3 kg (174 lb 14.4 oz)  06/13/17 80.5 kg (177 lb 7.5 oz)    Physical Exam:  BP (!) 146/97 (BP Location: Left Arm)   Pulse 94   Temp 97.8 F (36.6 C) (Oral)   Resp 18   Ht 5' (1.524 m)   Wt 79.3 kg (174 lb 14.4 oz)   SpO2 100%   BMI 34.16 kg/m  Constitutional: She appears well-developedand well-nourished. No distress.  HENT: Normocephalicand atraumatic.  Eyes: EOMare normal. No discharge.  Cardiovascular: RRR. No JVD. Respiratory: Effort normal and breath sounds normal.  GI: Bowel sounds are normal. She exhibits no distension. Musculoskeletal: She exhibits no edemaor tenderness.  Neurological: She is alertand oriented.  Able to follow basic commands.  Has poor insight into deficits.  LUE: 4/5 proximal to distal  RUE: 4+/5 proximal to distal LLE: 4+/5 proximally, 4+/5 distally RLE: 4/5 proximally, 4/5 distally Skin: Skin is warmand dry. She is not diaphoretic.  Psychiatric: She has a normal mood and affect. She is slowed. She expresses inappropriate judgment.    Assessment/Plan: 1. Functional deficits secondary to right midbrain infarct, lumbosacral spondylosis with radiculopathy which require 3+ hours per day  of interdisciplinary therapy in a comprehensive inpatient rehab setting. Physiatrist is providing close team supervision and 24 hour management of active medical problems listed below. Physiatrist and rehab team continue to assess barriers to discharge/monitor patient progress toward functional and medical goals.  Function:  Bathing Bathing position   Position: Shower  Bathing parts Body parts bathed by patient: Right arm, Left arm, Chest, Abdomen, Front perineal area, Buttocks, Right upper leg, Left upper leg, Right lower leg, Left lower leg, Back Body parts bathed by helper: Back  Bathing assist Assist Level: Supervision or verbal cues      Upper Body Dressing/Undressing Upper body dressing   What is the patient wearing?: Bra, Pull over shirt/dress Bra - Perfomed by patient: Thread/unthread right bra strap, Thread/unthread left bra strap, Hook/unhook bra (pull down sports bra)   Pull over shirt/dress - Perfomed by patient: Thread/unthread right sleeve, Thread/unthread left sleeve, Put head through opening, Pull shirt over trunk          Upper body assist Assist Level: Supervision or verbal cues      Lower Body Dressing/Undressing Lower body dressing   What is the patient wearing?: Underwear, Pants, Socks, Shoes Underwear - Performed by patient: Thread/unthread right underwear leg, Thread/unthread left underwear leg, Pull underwear up/down   Pants- Performed by patient: Thread/unthread right pants leg, Thread/unthread left pants leg, Pull pants up/down       Socks -  Performed by patient: Don/doff right sock, Don/doff left sock   Shoes - Performed by patient: Don/doff right shoe, Don/doff left shoe, Fasten right, Fasten left            Lower body assist Assist for lower body dressing: Supervision or verbal cues      Toileting Toileting   Toileting steps completed by patient: Adjust clothing prior to toileting, Performs perineal hygiene, Adjust clothing after  toileting Toileting steps completed by helper: Performs perineal hygiene Toileting Assistive Devices: Grab bar or rail  Toileting assist Assist level: Supervision or verbal cues   Transfers Chair/bed transfer   Chair/bed transfer method: Ambulatory Chair/bed transfer assist level: Supervision or verbal cues Chair/bed transfer assistive device: Armrests     Locomotion Ambulation     Max distance: 250 ft Assist level: Touching or steadying assistance (Pt > 75%)   Wheelchair   Type: Manual Max wheelchair distance: 150 ft Assist Level: Supervision or verbal cues  Cognition Comprehension Comprehension assist level: Follows complex conversation/direction with no assist  Expression Expression assist level: Expresses complex ideas: With no assist  Social Interaction Social Interaction assist level: Interacts appropriately with others - No medications needed.  Problem Solving Problem solving assist level: Solves complex 90% of the time/cues < 10% of the time  Memory Memory assist level: Recognizes or recalls 90% of the time/requires cueing < 10% of the time     Medical Problem List and Plan: 1. Left sided weakness and functional deficitssecondary to right midbrain infarct, lumbosacral spondylosis with radiculopathy  Cont CIR 2. DVT Prophylaxis/Anticoagulation: Pharmaceutical: Lovenox 3. Chronic back pain/Pain Management: Hydrocodone effective.  4. Mood: LCSW to follow for evaluate and support.  5. Neuropsych: This patient is not fully capable of making decisions on herown behalf. 6. Skin/Wound Care: routine pressure relief measures.  7. Fluids/Electrolytes/Nutrition: Monitor I/Os.   BMP within acceptable range on 9/4  Labs ordered for tomorrow 8. HTN: Monitor BP bid.   Overall controlled 9/6 9. Dyslipidemia: On lipitor.  10. Seizure disorder: On lamictal.  11. H/o recurrent strokes: Hypercoagulopathy panel negative.  12. Chronic insomnia: uses ambien at home.  13.  Constipation: resolved with miralax.  14. Prediabetes  Cont to monitor 15. Leukocytosis  Afebrile  Cont to monitor  Labs ordered for tomorrow   LOS (Days) 3 A FACE TO FACE EVALUATION WAS PERFORMED  Zahmir Lalla Lorie Phenix 06/20/2017 7:52 AM

## 2017-06-21 ENCOUNTER — Inpatient Hospital Stay (HOSPITAL_COMMUNITY): Payer: Medicare Other | Admitting: Occupational Therapy

## 2017-06-21 ENCOUNTER — Inpatient Hospital Stay (HOSPITAL_COMMUNITY): Payer: Medicare Other

## 2017-06-21 ENCOUNTER — Inpatient Hospital Stay (HOSPITAL_COMMUNITY): Payer: Medicare Other | Admitting: Physical Therapy

## 2017-06-21 LAB — CBC WITH DIFFERENTIAL/PLATELET
BASOS ABS: 0 10*3/uL (ref 0.0–0.1)
BASOS PCT: 0 %
Eosinophils Absolute: 0.5 10*3/uL (ref 0.0–0.7)
Eosinophils Relative: 4 %
HEMATOCRIT: 38.6 % (ref 36.0–46.0)
Hemoglobin: 12.2 g/dL (ref 12.0–15.0)
LYMPHS PCT: 29 %
Lymphs Abs: 3.7 10*3/uL (ref 0.7–4.0)
MCH: 28.9 pg (ref 26.0–34.0)
MCHC: 31.6 g/dL (ref 30.0–36.0)
MCV: 91.5 fL (ref 78.0–100.0)
Monocytes Absolute: 1 10*3/uL (ref 0.1–1.0)
Monocytes Relative: 8 %
Neutro Abs: 7.3 10*3/uL (ref 1.7–7.7)
Neutrophils Relative %: 59 %
Platelets: 307 10*3/uL (ref 150–400)
RBC: 4.22 MIL/uL (ref 3.87–5.11)
RDW: 15.1 % (ref 11.5–15.5)
WBC: 12.5 10*3/uL — AB (ref 4.0–10.5)

## 2017-06-21 LAB — BASIC METABOLIC PANEL
ANION GAP: 7 (ref 5–15)
BUN: 14 mg/dL (ref 6–20)
CO2: 25 mmol/L (ref 22–32)
Calcium: 9.5 mg/dL (ref 8.9–10.3)
Chloride: 106 mmol/L (ref 101–111)
Creatinine, Ser: 0.67 mg/dL (ref 0.44–1.00)
Glucose, Bld: 103 mg/dL — ABNORMAL HIGH (ref 65–99)
POTASSIUM: 4 mmol/L (ref 3.5–5.1)
SODIUM: 138 mmol/L (ref 135–145)

## 2017-06-21 MED ORDER — IRBESARTAN 75 MG PO TABS: 37.5000 mg | ORAL_TABLET | Freq: Every day | ORAL | Status: DC

## 2017-06-21 MED ORDER — METOPROLOL TARTRATE 25 MG PO TABS
25.0000 mg | ORAL_TABLET | Freq: Two times a day (BID) | ORAL | Status: DC
  Administered 2017-06-21 – 2017-06-24 (×6): 25 mg via ORAL
  Filled 2017-06-21 (×7): qty 1

## 2017-06-21 MED ORDER — IRBESARTAN 75 MG PO TABS
37.5000 mg | ORAL_TABLET | Freq: Every day | ORAL | Status: DC
  Administered 2017-06-21 – 2017-06-24 (×4): 37.5 mg via ORAL
  Filled 2017-06-21 (×4): qty 0.5

## 2017-06-21 MED ORDER — CLONIDINE HCL 0.1 MG PO TABS
0.1000 mg | ORAL_TABLET | Freq: Once | ORAL | Status: AC
  Administered 2017-06-21: 0.1 mg via ORAL
  Filled 2017-06-21: qty 1

## 2017-06-21 NOTE — Progress Notes (Signed)
BP trending up--will resume Diovan at 37.5 mg daily. Administer dose of catapres now or elevated BP

## 2017-06-21 NOTE — Progress Notes (Signed)
[place] PHYSICAL MEDICINE & REHABILITATION     PROGRESS NOTE  Subjective/Complaints:  Pt seen sitting at the EOB this AM.  She slept well overnight.  She states she is doing home Monday and has questions about discharge meds/appointments.   ROS: Denies CP, SOB, N/V/D.  Objective: Vital Signs: Blood pressure (!) 142/91, pulse (!) 112, temperature 98.2 F (36.8 C), temperature source Oral, resp. rate 18, height 5' (1.524 m), weight 79.3 kg (174 lb 14.4 oz), SpO2 99 %. No results found.  Recent Labs  06/18/17 1106 06/21/17 0521  WBC 11.3* 12.5*  HGB 12.4 12.2  HCT 38.9 38.6  PLT 306 307    Recent Labs  06/18/17 1106 06/21/17 0521  NA 138 138  K 4.2 4.0  CL 107 106  GLUCOSE 129* 103*  BUN 15 14  CREATININE 0.72 0.67  CALCIUM 9.9 9.5   CBG (last 3)  No results for input(s): GLUCAP in the last 72 hours.  Wt Readings from Last 3 Encounters:  06/17/17 79.3 kg (174 lb 14.4 oz)  06/13/17 80.5 kg (177 lb 7.5 oz)    Physical Exam:  BP (!) 142/91 (BP Location: Left Arm)   Pulse (!) 112   Temp 98.2 F (36.8 C) (Oral)   Resp 18   Ht 5' (1.524 m)   Wt 79.3 kg (174 lb 14.4 oz)   SpO2 99%   BMI 34.16 kg/m  Constitutional: She appears well-developedand well-nourished. No distress.  HENT: Normocephalicand atraumatic.  Eyes: EOMare normal. No discharge.  Cardiovascular: +Tachycardia. Regular rhythm. No JVD. Respiratory: Effort normal and breath sounds normal.  GI: Bowel sounds are normal. She exhibits no distension. Musculoskeletal: She exhibits no edemaor tenderness.  Neurological: She is alertand oriented.  Able to follow basic commands.  Has poor insight into deficits.  LUE: 4/5 proximal to distal  RUE: 4+/5 proximal to distal LLE: 4+/5 proximally, 4+/5 distally (stable) RLE: 4/5 proximally, 4/5 distally (stable) Skin: Skin is warmand dry. She is not diaphoretic.  Psychiatric: She has a normal mood and affect. She is slowed. She expresses inappropriate  judgment.    Assessment/Plan: 1. Functional deficits secondary to right midbrain infarct, lumbosacral spondylosis with radiculopathy which require 3+ hours per day of interdisciplinary therapy in a comprehensive inpatient rehab setting. Physiatrist is providing close team supervision and 24 hour management of active medical problems listed below. Physiatrist and rehab team continue to assess barriers to discharge/monitor patient progress toward functional and medical goals.  Function:  Bathing Bathing position   Position: Shower  Bathing parts Body parts bathed by patient: Right arm, Left arm, Chest, Abdomen, Front perineal area, Buttocks, Right upper leg, Left upper leg, Right lower leg, Left lower leg, Back Body parts bathed by helper: Back  Bathing assist Assist Level: Supervision or verbal cues      Upper Body Dressing/Undressing Upper body dressing   What is the patient wearing?: Bra, Pull over shirt/dress Bra - Perfomed by patient: Thread/unthread right bra strap, Thread/unthread left bra strap, Hook/unhook bra (pull down sports bra)   Pull over shirt/dress - Perfomed by patient: Thread/unthread right sleeve, Thread/unthread left sleeve, Put head through opening, Pull shirt over trunk          Upper body assist Assist Level: Supervision or verbal cues      Lower Body Dressing/Undressing Lower body dressing   What is the patient wearing?: Underwear, Pants, Socks, Shoes Underwear - Performed by patient: Thread/unthread right underwear leg, Thread/unthread left underwear leg, Pull underwear up/down  Pants- Performed by patient: Thread/unthread right pants leg, Thread/unthread left pants leg, Pull pants up/down       Socks - Performed by patient: Don/doff right sock, Don/doff left sock   Shoes - Performed by patient: Don/doff right shoe, Don/doff left shoe, Fasten right, Fasten left            Lower body assist Assist for lower body dressing: Supervision or verbal  cues      Toileting Toileting   Toileting steps completed by patient: Adjust clothing prior to toileting, Performs perineal hygiene, Adjust clothing after toileting Toileting steps completed by helper: Performs perineal hygiene Toileting Assistive Devices: Grab bar or rail  Toileting assist Assist level: Supervision or verbal cues   Transfers Chair/bed transfer   Chair/bed transfer method: Stand pivot Chair/bed transfer assist level: Touching or steadying assistance (Pt > 75%) Chair/bed transfer assistive device: Armrests     Locomotion Ambulation     Max distance: 250 ft Assist level: Supervision or verbal cues   Wheelchair   Type: Manual Max wheelchair distance: 150 ft Assist Level: Supervision or verbal cues  Cognition Comprehension Comprehension assist level: Follows complex conversation/direction with no assist  Expression Expression assist level: Expresses complex ideas: With no assist  Social Interaction Social Interaction assist level: Interacts appropriately with others - No medications needed.  Problem Solving Problem solving assist level: Solves complex 90% of the time/cues < 10% of the time  Memory Memory assist level: Recognizes or recalls 90% of the time/requires cueing < 10% of the time     Medical Problem List and Plan: 1. Left sided weakness and functional deficitssecondary to right midbrain infarct, lumbosacral spondylosis with radiculopathy  Cont CIR 2. DVT Prophylaxis/Anticoagulation: Pharmaceutical: Lovenox 3. Chronic back pain/Pain Management: Hydrocodone effective.  4. Mood: LCSW to follow for evaluate and support.  5. Neuropsych: This patient is ?fully capable of making decisions on herown behalf. 6. Skin/Wound Care: routine pressure relief measures.  7. Fluids/Electrolytes/Nutrition: Monitor I/Os.   BMP within acceptable range on 9/7 8. HTN: Monitor BP bid.   Overall controlled 9/7 9. Dyslipidemia: On lipitor.  10. Seizure disorder: On  lamictal.  11. H/o recurrent strokes: Hypercoagulopathy panel negative.  12. Chronic insomnia: uses ambien at home.  13. Constipation: resolved with miralax.  14. Prediabetes  Cont to monitor  Controlled 9/7 15. Leukocytosis with SIRS  Afebrile  Cont to monitor  WBCs 12.5 on 9/7  UA/Ucx, CXR ordered  LOS (Days) 4 A FACE TO FACE EVALUATION WAS PERFORMED  [place] Lora Lorie Phenix 06/21/2017 7:56 AM

## 2017-06-21 NOTE — Progress Notes (Signed)
Patient has had issues with tachycardia--was on metoprolol 100 mg bid at home. Will resume at 25 mg bid and titrate upwards

## 2017-06-21 NOTE — Progress Notes (Signed)
Occupational Therapy Session Note  Patient Details  Name: Leslie Simmons MRN: [id number] Date of Birth: 15-Jan-1966  Today's Date: 06/21/2017 OT Individual Time: 0800-0902 OT Individual Time Calculation (min): 62 min    Short Term Goals: Week 1:  OT Short Term Goal 1 (Week 1): STGs equal to LTGs based on ELOS  Skilled Therapeutic Interventions/Progress Updates:    Pt completed bathing and dressing during session.  Close supervision for ambulation to the shower and for gathering items prior to shower.  She completed shower with supervision sit to stand as well as dressing.  She donned underpants and bra sit to stand in the bathroom and then ambulated out to the bedside chair for donning the rest of the clothing.  Supervision for standing at the sink for brushing teeth and donning deodorant.  Once finished, pt ambulated down to the therapy gym with supervision for mobility.  Once in the therapy gym had pt work on peg activity alternating UEs to reach.  Incorporated standing and reaching down to the floor to pick up pegs, emphasizing knee flexion and reaching to the left with the LUE.  Min guard assist and increased time needed to copy peg design.  Pt completed session with transfer back to room where she was positioned in bedside chair with call button and phone in reach.    Therapy Documentation Precautions:  Precautions Precautions: Fall Precaution Comments: mild Left hemiparesis Restrictions Weight Bearing Restrictions: No  Pain: Pain Assessment Pain Assessment: 0-10 Pain Score: 5  Pain Type: Chronic pain Pain Location: Back Pain Descriptors / Indicators: Discomfort Pain Intervention(s): Repositioned ADL: See Function Navigator for Current Functional Status.   Therapy/Group: Individual Therapy  Pete Schnitzer OTR/L 06/21/2017, 10:13 AM

## 2017-06-21 NOTE — Progress Notes (Signed)
Occupational Therapy Session Note  Patient Details  Name: Leslie Simmons MRN: [id number] Date of Birth: 1966/03/05  Today's Date: 06/21/2017 OT Individual Time: 8295-6213 OT Individual Time Calculation (min): 27 min    Short Term Goals: Week 1:  OT Short Term Goal 1 (Week 1): STGs equal to LTGs based on ELOS  Skilled Therapeutic Interventions/Progress Updates:    Pt ambulated to the bathroom to start session with min guard assist and no assistive device.  Pt completed toileting and then ambulated to wash her hands with min guard assist as well.  Had her use th rollator to ambulate to the day room secondary to her reporting that she was tired from therapy sessions earlier.  Supervision for mobility to and from the dayroom.  Completed two, 4 minute intervals using the UE ergonometer to help increase coordination and strength in BUEs.  Resistance set on level 10 with RPMs maintained at 25-26.  Pt needed 1-2 minute rest break in-between the first and second set.  Finished session with functional mobility back to the room where pt was left sitting EOB with NT present for vitals.    Therapy Documentation Precautions:  Precautions Precautions: Fall Precaution Comments: mild Left hemiparesis Restrictions Weight Bearing Restrictions: No  Pain: Pain Assessment Pain Assessment: No/denies pain ADL: See Function Navigator for Current Functional Status.   Therapy/Group: Individual Therapy  Yaritzel Stange OTR/L 06/21/2017, 3:36 PM

## 2017-06-21 NOTE — Progress Notes (Signed)
Occupational Therapy Session Note  Patient Details  Name: Leslie Simmons MRN: [id number] Date of Birth: 05/01/1966  Today's Date: 06/21/2017 OT Individual Time: 1000-1030 OT Individual Time Calculation (min): 30 min    Short Term Goals: Week 1:  OT Short Term Goal 1 (Week 1): STGs equal to LTGs based on ELOS   Skilled Therapeutic Interventions/Progress Updates:    Pt seen this session to address dynamic balance with activities in the kitchen. Pt ambulated with contact guard to kitchen without an AD.  Pt practiced using her R hand to transfer plastic fruit from bowl to platter using a grasp on large objects and tip pinch on small ones while wt shifting to L side and working on small L leg lunges during the wt shift laterally. She then worked on forward wt shift over L leg while reaching into pantry with R hand to remove items from the shelves.  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 Practiced cutting lunch meat using a rocking motion with knife in L hand then R hand and also tried regular cutting method using knife in L hand and holding fork in R to stabilize the meat. Pt accomplished task well.   Pt also discussed her back pain. Pt practiced a lateral stretch up the wall for her QL muscles on B sides.  She then ambulated back to room and resting in recliner with all needs met.  Therapy Documentation Precautions:  Precautions Precautions: Fall Precaution Comments: mild Left hemiparesis Restrictions Weight Bearing Restrictions: No    Vital Signs: Therapy Vitals Temp: 98.2 F (36.8 C) Temp Source: Oral Pulse Rate: (!) 112 Resp: 18 BP: (!) 142/91 Patient Position (if appropriate): Sitting Oxygen Therapy SpO2: 99 % O2 Device: Not Delivered Pain: Pain Assessment Pain Assessment: 0-10 Pain Score: 5  Pain Type: Chronic pain Pain Location: Back Pain Descriptors / Indicators: Discomfort Pain Intervention(s): Repositioned ADL:    See  Function Navigator for Current Functional Status.   Therapy/Group: Individual Therapy  [place] 06/21/2017, 8:53 AM

## 2017-06-21 NOTE — Plan of Care (Signed)
Problem: RH BOWEL ELIMINATION Goal: RH STG MANAGE BOWEL WITH ASSISTANCE STG Manage Bowel with Mod I Assistance.  Outcome: Progressing Pt has c/o constipation due to iron supplement, has 2 bm's on 9/6 after increase in miralax dose and prune juice mixture.

## 2017-06-21 NOTE — Discharge Summary (Signed)
Physician Discharge Summary  Patient ID: Leslie Simmons MRN: [id number] DOB/AGE: 1965-11-22 51 y.o.  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  No current chest complaints. EXAM: CHEST  2 VIEW COMPARISON:  Single-view of the chest 06/14/2017. FINDINGS: The lungs are clear. Heart size is normal. No pneumothorax or pleural fluid. No bony abnormality. IMPRESSION: Negative chest. Electronically Signed   By: Leslie Simmons M.D.   On: 06/21/2017 13:08    Labs:  Basic Metabolic Panel:  Recent Labs Lab 06/18/17 1106 06/21/17 0521  NA 138 138  K 4.2 4.0  CL 107 106  CO2 23 25  GLUCOSE 129* 103*  BUN 15 14  CREATININE 0.72 0.67  CALCIUM 9.9 9.5    CBC:  Recent Labs Lab 06/18/17 1106 06/21/17 0521 06/22/17 0414 06/24/17 0437  WBC 11.3* 12.5* 11.2* 13.4*  NEUTROABS 7.3 7.3 6.5  --   HGB 12.4 12.2 11.9* 12.1  HCT 38.9 38.6 37.8 38.6  MCV 89.8 91.5 91.3 91.5  PLT 306 307 326 309    CBG: No results for input(s): GLUCAP in the last 168 hours.  Brief HPI:    Leslie Simmons a 51 y.o.femalewith history fo HTN, hemorrhagic stroke with residual right sided weakness/numbnessand seizure who was admitted on 06/13/17 with reports of back pain and LLE weakness since the night before. MRI brain done revealing acute nonhemorrhagic infarct involving lateral right midbrain at right corticospinal tract and Remote L-MCA and basal ganglia infarcts.MRI lumbar spine done revealing L4 onL5 anterolisthesis with severe canal and  bilateral subarticular stenosis and left subarticular Disc protrusion L5-S1 with impingement of S1 nerve. Work up ongoing and NS consulted for input on stenosis. Dr. Ronnald Ramp recommended therapy and elected decompression in the future. Therapy evaluations initiated revealing deficits in gait, ability to ADLs, lability and mild dysarthria with mild deficits in working memory. CIR recommended by rehab team   Hospital Course: [name] was admitted to rehab 06/17/2017 for inpatient therapies to consist of PTand OT at least three hours five days a week. Past admission physiatrist, therapy team and rehab RN have worked together to provide customized collaborative inpatient rehab.  Po intake has been good and she is continent of bowel and bladder. Follow up CBC showed persistent leucocytosis but not fever or other signs of infection noted. UA/UCS checked on 9/8 and was negative for infection. CXR showed lungs to be clear.  Check of lytes showed electrolytes and renal status to be WNL. Blood pressures were monitored on bid basis and were noted to be trending upwards. Low dose metoprolol was resumed to help manage tachycardia. Avapro 37.5 mg was added and blood pressures have shown good control on current regimen. Anxiety levels have improved with ego support provided by team. She has tolerated increase in activity and back pain has improved. She is currently requiring hydrocodone once a day to tolerate therapy and this was discontinued at discharge.  LLE strength is improving with improvement in balance and mobility. Mood is stable and chronic insomnia as been managed with decrease  in dose of Ambien. She has made good progress during her rehab stay and is modified independent at discharge. She has been educated on importance of maintaining HEP and will continue to receive follow up HHPT and [place] by [place] after discharge.     Rehab course: During patient's stay in rehab team conference was held to monitor  patient's progress, set goals and discuss barriers to discharge. At admission, patient required min assist with mobility and basic self-care tasks. Cognitive evaluation revealed mild baseline cognitive deficits with decreased recall but good awareness and ability to compensate for these deficits therefore no ST needed during her stay. She has had improvement in activity tolerance, balance, postural control, as well as ability to compensate for deficits. She is able to complete ADL tasks as modified independent levels. She is modified independent for transfers and is ambulating > 500' at modified independent level. With fatigue, her gait speed decreases with decreased stance on LLE. Berg Balance score has improved to 54/56. Family education was completed regarding all aspects of care and boyfriend to assist as needed after discharge.    Disposition: 01-Home or Self Care  Diet: Heart Healthy diet.   Special Instructions: 1. No driving or strenuous activity till cleared by MD.   Discharge Instructions    Ambulatory referral to Physical Medicine Rehab    Complete by:  As directed    1-2 weeks transitional care appt     Allergies as of 06/24/2017      Reactions   Gabapentin Swelling   Codeine Rash      Medication List    TAKE these medications   aspirin 325 MG EC tablet Take 1 tablet (325 mg total) by mouth daily.   atorvastatin 80 MG tablet Commonly known as:  LIPITOR Take 1 tablet (80 mg total) by mouth daily at 6 PM.   clopidogrel 75 MG tablet Commonly known as:  PLAVIX Take 1 tablet (75 mg total) by mouth daily.   diclofenac sodium 1 % Gel Commonly known as:  VOLTAREN Apply 2 g topically 4 (four) times daily.   diphenhydrAMINE 25 mg capsule Commonly known as:  BENADRYL Take 1 capsule (25 mg total) by mouth every 6 (six) hours as needed for itching.   docusate sodium 100 MG capsule Commonly known as:  COLACE Take 2 capsules (200 mg total) by mouth daily. What changed:  how  much to take   ferrous sulfate 325 (65 FE) MG tablet Take 1 tablet (325 mg total) by mouth daily.   irbesartan 75 MG tablet Commonly known as:  AVAPRO Take 0.5 tablets (37.5 mg total) by mouth daily.   lamoTRIgine 100 MG tablet Commonly known as:  LAMICTAL Take 2 tablets (200 mg total) by mouth daily.   metoprolol tartrate 25 MG tablet Commonly known as:  LOPRESSOR Take 1 tablet (25 mg total) by mouth 2 (two) times daily. What changed:  medication strength  how much to take   polyethylene glycol packet Commonly known as:  MIRALAX / GLYCOLAX Take 17 g by mouth 2 (two) times daily.   senna-docusate 8.6-50 MG tablet Commonly known as:  Senokot-S Take 1 tablet by mouth at bedtime as needed for mild constipation.   Vitamin B-12 1000 MCG Subl Place 1,000 mcg under the tongue daily.   zolpidem 5 MG tablet Commonly known as:  AMBIEN Take 1 tablet (5 mg total) by mouth at bedtime. What changed:  Another medication with the same name was added. Make sure you  understand how and when to take each.   zolpidem 5 MG tablet--Rx # 10 pills  Commonly known as:  AMBIEN Take 1 tablet (5 mg total) by mouth at bedtime as needed for sleep. What changed:  You were already taking a medication with the same name, and this prescription was added. Make sure you understand how and when to take each.          Follow-up Information    Jamse Arn, MD Follow up.   Specialty:  Physical Medicine and Rehabilitation Why:  office will call you with follow up appointment Contact information: [address] [place] [address] [phone]        Rosalin Hawking, MD Follow up.   Specialty:  Neurology Why:  Call today for follow up appointment on stroke/aneurysm.  Contact information: [address] [place] [address] [phone]           Signed: Bary Leriche 06/25/2017, 9:21 AM

## 2017-06-21 NOTE — Progress Notes (Signed)
Physical Therapy Session Note  Patient Details  Name: Leslie Simmons MRN: [id number] Date of Birth: 1966/06/15  Today's Date: 06/21/2017 PT Individual Time: 0915-0956  PT Individual Time Calculation (min): 41 min  PT Missed Time Calculation (min): 60 min   Short Term Goals: Week 1:  PT Short Term Goal 1 (Week 1): =LTGs due to ELOS  Skilled Therapeutic Interventions/Progress Updates:   Session 1:  Pt in recliner and agreeable to therapy, no c/o pain. Ambulated to/from therapy gym w/ rollator and supervision to work on functional endurance, ~150' each way. Worked on standing NMR this session to improve dynamic standing balance, independence w/ postural perturbations, and balance strategies w/o UE support. All activities w/ close supervision. -5x sit<>stand on firm surface and on foam -tossing Gaubert standing on foam, 2 min bouts  -matching cards while standing on foam, requiring pt to reach up outside of BOS and squat lower to reach -step up, 6" step, w/ LLE, 2x10 reps  Practiced ambulating w/o AD w/ verbal cues for gait pattern and close supervision, 300'. Increased BOS w/o AD, however safe. Pt still ambulates w/ decreased L knee flexion in stance and loading response, R/L circumducted gait, and mild L antalgic gait. Ended session in recliner, call bell within reach.   Session 2:  RN and PA-C requesting to hold therapy for second session this afternoon secondary to high diastolic BP.    Therapy Documentation Precautions:  Precautions Precautions: Fall Precaution Comments: mild Left hemiparesis Restrictions Weight Bearing Restrictions: No Pain: Pain Assessment Pain Assessment: 0-10 Pain Score: 5  Pain Type: Chronic pain Pain Location: Back Pain Descriptors / Indicators: Discomfort Pain Intervention(s): Repositioned  See Function Navigator for Current Functional Status.   Therapy/Group: Individual Therapy  Calib Wadhwa K Arnette 06/21/2017, 9:57 AM

## 2017-06-21 NOTE — Progress Notes (Signed)
Occupational Therapy Discharge Summary  Patient Details  Name: Leslie Simmons MRN: [id number] Date of Birth: 1965-10-27   Patient has met 12 of 12 long term goals due to improved balance, ability to compensate for deficits, functional use of  LEFT upper and LEFT lower extremity and improved coordination.  Patient to discharge at overall Modified Independent level.  Patient's care partner is independent to provide the necessary physical assistance at discharge.    Reasons goals not met: NA  Recommendation:  Patient will benefit from ongoing skilled OT services in home health setting to continue to advance functional skills in the area of BADL, iADL and Reduce care partner burden.  Equipment: No equipment provided  Reasons for discharge: treatment goals met and discharge from hospital  Patient/family agrees with progress made and goals achieved: Yes  OT Discharge Precautions/Restrictions  Precautions Precautions: Fall Precaution Comments: mild Left hemiparesis Restrictions Weight Bearing Restrictions: No Vision Baseline Vision/History: Wears glasses Wears Glasses: At all times Patient Visual Report: Blurring of vision Cognition Overall Cognitive Status: History of cognitive impairments - at baseline Arousal/Alertness: Awake/alert Orientation Level: Oriented X4 Attention: Selective Sustained Attention: Appears intact Selective Attention: Appears intact Memory: Impaired Memory Impairment: Decreased recall of new information Awareness: Appears intact Problem Solving: Appears intact Safety/Judgment: Appears intact Comments: Pt reports some memory issues from previous CVA. Sensation Sensation Light Touch: Impaired by gross assessment Proprioception: Appears Intact Additional Comments: reports numbness in L LE and tingling in bottom of L foot Coordination Gross Motor Movements are Fluid and Coordinated: Yes Fine Motor Movements are Fluid and Coordinated: Yes Motor   Motor Motor: Hemiplegia Motor - Discharge Observations: Mild left hemiparesis, more affected in the LE compared to the UE. Mobility  Transfers Transfers: Sit to Stand;Stand to Sit Sit to Stand: 6: Modified independent (Device/Increase time);With armrests;With upper extremity assist Stand to Sit: 6: Modified independent (Device/Increase time);With armrests;With upper extremity assist;To chair/3-in-1  Trunk/Postural Assessment  Cervical Assessment Cervical Assessment: Within Functional Limits Thoracic Assessment Thoracic Assessment: Within Functional Limits Lumbar Assessment Lumbar Assessment: Within Functional Limits Postural Control Postural Control: Within Functional Limits  Balance Balance Balance Assessed: Yes Dynamic Sitting Balance Dynamic Sitting - Balance Support: During functional activity Dynamic Sitting - Level of Assistance: 7: Independent Static Standing Balance Static Standing - Balance Support: During functional activity Static Standing - Level of Assistance: 7: Independent Dynamic Standing Balance Dynamic Standing - Balance Support: During functional activity Dynamic Standing - Level of Assistance: 6: Modified independent (Device/Increase time) Extremity/Trunk Assessment RUE Assessment RUE Assessment: Exceptions to [facility] RUE Strength RUE Overall Strength Comments: AROM WFLS for shoulder, elbow, and wrist,  Strength in these areas 3+/5.  decreased FM coordination from previous CVA resulting in decreased efficiency with opposition of thumb to all digits.  Gross grasp strength 4/5 in the hand however. LUE Assessment LUE Assessment: Within Functional Limits   See Function Navigator for Current Functional Status.  MCGUIRE,JAMES OTR/L 06/21/2017, 3:55 PM

## 2017-06-22 LAB — URINALYSIS, COMPLETE (UACMP) WITH MICROSCOPIC
Bacteria, UA: NONE SEEN
Bilirubin Urine: NEGATIVE
GLUCOSE, UA: NEGATIVE mg/dL
Ketones, ur: NEGATIVE mg/dL
Leukocytes, UA: NEGATIVE
Nitrite: NEGATIVE
Protein, ur: NEGATIVE mg/dL
SPECIFIC GRAVITY, URINE: 1.013 (ref 1.005–1.030)
pH: 6 (ref 5.0–8.0)

## 2017-06-22 LAB — CBC WITH DIFFERENTIAL/PLATELET
Basophils Absolute: 0 10*3/uL (ref 0.0–0.1)
Basophils Relative: 0 %
Eosinophils Absolute: 0.5 10*3/uL (ref 0.0–0.7)
Eosinophils Relative: 4 %
HCT: 37.8 % (ref 36.0–46.0)
Hemoglobin: 11.9 g/dL — ABNORMAL LOW (ref 12.0–15.0)
Lymphocytes Relative: 31 %
Lymphs Abs: 3.4 10*3/uL (ref 0.7–4.0)
MCH: 28.7 pg (ref 26.0–34.0)
MCHC: 31.5 g/dL (ref 30.0–36.0)
MCV: 91.3 fL (ref 78.0–100.0)
Monocytes Absolute: 0.8 10*3/uL (ref 0.1–1.0)
Monocytes Relative: 7 %
Neutro Abs: 6.5 10*3/uL (ref 1.7–7.7)
Neutrophils Relative %: 58 %
Platelets: 326 10*3/uL (ref 150–400)
RBC: 4.14 MIL/uL (ref 3.87–5.11)
RDW: 14.9 % (ref 11.5–15.5)
WBC: 11.2 10*3/uL — ABNORMAL HIGH (ref 4.0–10.5)

## 2017-06-22 NOTE — Plan of Care (Signed)
Problem: RH SAFETY Goal: RH STG ADHERE TO SAFETY PRECAUTIONS W/ASSISTANCE/DEVICE STG Adhere to Safety Precautions With Mod I Assistance/Device.  Outcome: Not Progressing Pt is impulsive and requires reminders to use call light

## 2017-06-22 NOTE — Progress Notes (Signed)
Leslie Simmons is a 51 y.o. female [birth year] [id number]  Subjective: No new complaints. No new problems. Slept well. Feeling OK. Husband is on a speakerphone  Objective: Vital signs in last 24 hours: Temp:  [98.2 F (36.8 C)] 98.2 F (36.8 C) (09/08 0440) Pulse Rate:  [72-117] 72 (09/08 0440) Resp:  [17-18] 18 (09/08 0440) BP: (107-152)/(65-113) 107/65 (09/08 0440) SpO2:  [99 %] 99 % (09/08 0440) Weight change:  Last BM Date: 06/21/17  Intake/Output from previous day: 09/07 0701 - 09/08 0700 In: 720 [P.O.:720] Out: 200 [Urine:200] Last cbgs: CBG (last 3)  No results for input(s): GLUCAP in the last 72 hours.   Physical Exam General: No apparent distress   HEENT: not dry Lungs: Normal effort. Lungs clear to auscultation, no crackles or wheezes. Cardiovascular: Regular rate and rhythm, no edema Abdomen: S/NT/ND; BS(+) Musculoskeletal:  unchanged Neurological: No new neurological deficits Wounds: N/A    Skin: clear  Aging changes Mental state: Alert, oriented, cooperative    Lab Results: BMET    Component Value Date/Time   NA 138 06/21/2017 0521   K 4.0 06/21/2017 0521   CL 106 06/21/2017 0521   CO2 25 06/21/2017 0521   GLUCOSE 103 (H) 06/21/2017 0521   BUN 14 06/21/2017 0521   CREATININE 0.67 06/21/2017 0521   CALCIUM 9.5 06/21/2017 0521   GFRNONAA >60 06/21/2017 0521   GFRAA >60 06/21/2017 0521   CBC    Component Value Date/Time   WBC 11.2 (H) 06/22/2017 0414   RBC 4.14 06/22/2017 0414   HGB 11.9 (L) 06/22/2017 0414   HCT 37.8 06/22/2017 0414   PLT 326 06/22/2017 0414   MCV 91.3 06/22/2017 0414   MCH 28.7 06/22/2017 0414   MCHC 31.5 06/22/2017 0414   RDW 14.9 06/22/2017 0414   LYMPHSABS 3.4 06/22/2017 0414   MONOABS 0.8 06/22/2017 0414   EOSABS 0.5 06/22/2017 0414   BASOSABS 0.0 06/22/2017 0414    Studies/Results: Dg Chest 2 View  Result Date: 06/21/2017 CLINICAL DATA:  Leukocytosis.  No current chest complaints. EXAM: CHEST  2 VIEW COMPARISON:   Single-view of the chest 06/14/2017. FINDINGS: The lungs are clear. Heart size is normal. No pneumothorax or pleural fluid. No bony abnormality. IMPRESSION: Negative chest. Electronically Signed   By: Inge Rise M.D.   On: 06/21/2017 13:08    Medications: I have reviewed the patient's current medications.  Assessment/Plan:  1. Left sided weakness and functional deficitssecondary to right midbrain infarct, lumbosacral spondylosis with radiculopathy - CIR. On ASA 2. DVT proph w/Lovenox 3. LBP - Norco 4. HTN: Clonidine, Lopressor, Irbesartan 5. Dyslipidemia - Lipitor 6. Seizure disorder - Lamictal 7. Pre-DM: monitoring labs       Length of stay, days: 5  Walker Kehr , MD 06/22/2017, 10:25 AM

## 2017-06-23 ENCOUNTER — Inpatient Hospital Stay (HOSPITAL_COMMUNITY): Payer: Medicare Other

## 2017-06-23 ENCOUNTER — Inpatient Hospital Stay (HOSPITAL_COMMUNITY): Payer: Medicare Other | Admitting: Occupational Therapy

## 2017-06-23 LAB — URINE CULTURE

## 2017-06-23 NOTE — Progress Notes (Signed)
Occupational Therapy Session Note  Patient Details  Name: Leslie Simmons MRN: [id number] Date of Birth: [birth year]  Today's Date: 06/23/2017 OT Individual Time: 0800-0900 and 1400-1430 OT Individual Time Calculation (min): 60 min and 30 min   Short Term Goals: Week 1:  OT Short Term Goal 1 (Week 1): STGs equal to LTGs based on ELOS  Skilled Therapeutic Interventions/Progress Updates:    Session One: Pt seen for OT session focusing on education and ADL re-training. Pt desired to wait until P.M. Session to shower. Pt sitting EOB upon arrival having just received breakfast tray. Pt agreeable to tx session though desiring to finish breakfast. While pt finished breakfast, extensive education and discussion took place regarding CVA, controllable and uncontrollable risk factors, healthy diet, and importance of seeking medical attention immediatly if stroke is suspected. Referenced [employer] stroke recovery book and encouraged pt to cont reading during free time. Also educated and assisted pt with cell phone app "My Fitness Pal" for tracking calories, sodium, etc for daily diet. Pt very appreciative and enthusiastic with new app. She ambulated throughout room without AD mod I to complete grooming tasks at sink and returning to EOB to don shoes/socks.  She ambulated to ADL apartment and practiced retrieving items from overhead cabinet. Education provided regarding how to read nutrition labels including sodium count and serving size as pt concerned about maintaining heart healthy diet at d/c.  Pt returned to room at end of session, left seated in recliner with all needs in reach.  Pt made mod I in room in prep for d/c home tomorrow, encouraged her to call for assist if needed.   Session Two: Pt seen for OT session focusing on ADL re-training. Pt in supine upon arrival, easily awoken and agreeable to tx session. She ambulated throughout session mod I to gather items in prep for showering task. She bathed sit <> stand  mod I and dressed with increased time in bathroom. She ambulated to sink and completed standing grooming tasks, using B UEs. Education provided throughout session regarding stroke recovery, healthy diet, and d/c planning.  Pt voiced feeling comfortable and confident with planned d/c home tomorrow.   Therapy Documentation Precautions:  Precautions Precautions: Fall Precaution Comments: mild Left hemiparesis Restrictions Weight Bearing Restrictions: No Pain:   No/ denies pain  See Function Navigator for Current Functional Status.   Therapy/Group: Individual Therapy  Lewis, Maegan Buller C 06/23/2017, 6:48 AM

## 2017-06-23 NOTE — Progress Notes (Signed)
Physical Therapy Discharge Summary  Patient Details  Name: Leslie Simmons MRN: [id number] Date of Birth: [birth year]  Today's Date: 06/23/2017 PT Individual Time: 4008-6761, 1530-1600 PT Individual Time Calculation (min): 71 min , 30 min   Patient has met 6 of 6 long term goals due to improved activity tolerance, improved balance, increased strength, decreased pain, functional use of  left lower extremity and improved coordination.  Patient to discharge at an ambulatory level Modified Independent.   Patient's care partner is independent to provide the necessary physical assistance at discharge.  Reasons goals not met: all goals met  Recommendation:  Patient will benefit from ongoing skilled PT services in outpatient setting to continue to advance safe functional mobility, address ongoing impairments in strength, balance, endurance and minimize fall risk.  Equipment: No equipment provided  Reasons for discharge: treatment goals met  Patient/family agrees with progress made and goals achieved: Yes   Treatment Interventions: Session 1: Pt seated in recliner upon PT arrival, agreeable to therapy tx and reports 6/10 pain. Pt ambulated within the unit Mod I >250 ft, widened BoS without AD. Pt performed Berg Balance Test and scored a 54/56, discussed results with patient. Pt reviewed and performed HEP to include: 2 x 10 standing hip abduction/hip extension, 2 x 10 standing HS curls, 2 x 10 wall slides, 2 x 10 mini squats, 2 x 10 bridges with and without TB, 2 x 10 lateral step ups on 6 inch step, sidestepping 4 x 10 ft with level 2 TB (handout provided). Pt performed bed<>chair transfers, car transfer, sit<>stand transfers and all bed mobility Mod I. Pt ascended/descended flight of stairs x 12 steps using single handrail and with supervision, step to pattern going up and reciprocal going down. Pt left stead in recliner in room at end of session with needs in reach.   Session 2: Pt's boyfriend Juanda Crumble)  present for entire session, pt denies pain and agreeable to therapy tx. Session focused on community distance ambulation and ambulating on uneven surfaces. Pt ambulated >500 ft within the hospital and outside all Mod I. As pt becomes fatigued with longer distance ambulation, pt demonstrates decreased gait speed, decreased stance time on L LE and widened base of support. Pt ambulated on uneven surfaces outside x 50 ft on the grass, pavement and up/down a curb. Pt required single rest break due to fatigue, educated on hamstring and calf stretches while resting. Pt left in room at end of session with all needs in reach.    PT Discharge Precautions/Restrictions Precautions Precautions: Fall Precaution Comments: mild Left hemiparesis Restrictions Weight Bearing Restrictions: No Pain   Pt reports 6/10 pain/cramping in L posterior thigh Cognition Overall Cognitive Status: History of cognitive impairments - at baseline Arousal/Alertness: Awake/alert Orientation Level: Oriented X4 Attention: Selective Sustained Attention: Appears intact Selective Attention: Appears intact Memory: Impaired Memory Impairment: Decreased recall of new information Awareness: Appears intact Problem Solving: Appears intact Safety/Judgment: Appears intact Comments: Pt reports some memory issues from previous CVA. Sensation Sensation Light Touch: Impaired by gross assessment Stereognosis: Not tested Hot/Cold: Not tested Proprioception: Appears Intact Additional Comments: reports numbness in L LE and tingling in bottom of L foot Coordination Gross Motor Movements are Fluid and Coordinated: Yes (B LEs) Fine Motor Movements are Fluid and Coordinated: Yes (B LEs) Motor  Motor Motor: Hemiplegia Motor - Skilled Clinical Observations: Mild left hemiparesis, more affected in the LE compared to the UE.  Trunk/Postural Assessment  Cervical Assessment Cervical Assessment: Within Functional Limits Thoracic  Assessment Thoracic  Assessment: Within Functional Limits Lumbar Assessment Lumbar Assessment: Within Functional Limits Postural Control Postural Control: Within Functional Limits  Balance Balance Balance Assessed: Yes Standardized Balance Assessment Standardized Balance Assessment: Berg Balance Test Berg Balance Test Sit to Stand: Able to stand without using hands and stabilize independently Standing Unsupported: Able to stand safely 2 minutes Sitting with Back Unsupported but Feet Supported on Floor or Stool: Able to sit safely and securely 2 minutes Stand to Sit: Sits safely with minimal use of hands Transfers: Able to transfer safely, minor use of hands Standing Unsupported with Eyes Closed: Able to stand 10 seconds safely Standing Ubsupported with Feet Together: Able to place feet together independently and stand 1 minute safely From Standing, Reach Forward with Outstretched Arm: Can reach confidently >25 cm (10") From Standing Position, Pick up Object from Floor: Able to pick up shoe safely and easily From Standing Position, Turn to Look Behind Over each Shoulder: Looks behind from both sides and weight shifts well Turn 360 Degrees: Able to turn 360 degrees safely in 4 seconds or less Standing Unsupported, Alternately Place Feet on Step/Stool: Able to stand independently and safely and complete 8 steps in 20 seconds Standing Unsupported, One Foot in Front: Able to place foot tandem independently and hold 30 seconds Standing on One Leg: Able to lift leg independently and hold equal to or more than 3 seconds Total Score: 54 Static Sitting Balance Static Sitting - Level of Assistance: 6: Modified independent (Device/Increase time) Dynamic Sitting Balance Dynamic Sitting - Level of Assistance: 7: Independent Static Standing Balance Static Standing - Level of Assistance: 7: Independent Dynamic Standing Balance Dynamic Standing - Level of Assistance: 6: Modified independent  (Device/Increase time) Extremity Assessment  RLE Assessment RLE Assessment: Within Functional Limits LLE Assessment LLE Assessment: Within Functional Limits (grossly 4/5 throughout)   See Function Navigator for Current Functional Status.  Netta Corrigan, PT, DPT 06/23/2017, 10:29 AM

## 2017-06-23 NOTE — Progress Notes (Signed)
Rishika Mccollom is a 51 y.o. female [birth year] [id number]  Subjective: C/o cramps in the L thigh. No new problems. Slept well. Feeling OK.  Objective: Vital signs in last 24 hours: Temp:  [98.2 F (36.8 C)] 98.2 F (36.8 C) (09/09 0330) Pulse Rate:  [83-90] 90 (09/09 0330) Resp:  [18] 18 (09/09 0330) BP: (130-137)/(67-91) 130/67 (09/09 0330) SpO2:  [95 %-99 %] 95 % (09/09 0330) Weight change:  Last BM Date: 06/21/17  Intake/Output from previous day: 09/08 0701 - 09/09 0700 In: 840 [P.O.:840] Out: -  Last cbgs: CBG (last 3)  No results for input(s): GLUCAP in the last 72 hours.   Physical Exam General: No apparent distress  Obese HEENT: not dry Lungs: Normal effort. Lungs clear to auscultation, no crackles or wheezes. Cardiovascular: Regular rate and rhythm, no edema Abdomen: S/NT/ND; BS(+) Musculoskeletal:  L posterior thigh is sensitive; B calves WNL Neurological: No new neurological deficits Wounds: N/A    Skin: clear  Aging changes Mental state: Alert, oriented, cooperative    Lab Results: BMET    Component Value Date/Time   NA 138 06/21/2017 0521   K 4.0 06/21/2017 0521   CL 106 06/21/2017 0521   CO2 25 06/21/2017 0521   GLUCOSE 103 (H) 06/21/2017 0521   BUN 14 06/21/2017 0521   CREATININE 0.67 06/21/2017 0521   CALCIUM 9.5 06/21/2017 0521   GFRNONAA >60 06/21/2017 0521   GFRAA >60 06/21/2017 0521   CBC    Component Value Date/Time   WBC 11.2 (H) 06/22/2017 0414   RBC 4.14 06/22/2017 0414   HGB 11.9 (L) 06/22/2017 0414   HCT 37.8 06/22/2017 0414   PLT 326 06/22/2017 0414   MCV 91.3 06/22/2017 0414   MCH 28.7 06/22/2017 0414   MCHC 31.5 06/22/2017 0414   RDW 14.9 06/22/2017 0414   LYMPHSABS 3.4 06/22/2017 0414   MONOABS 0.8 06/22/2017 0414   EOSABS 0.5 06/22/2017 0414   BASOSABS 0.0 06/22/2017 0414    Studies/Results: Dg Chest 2 View  Result Date: 06/21/2017 CLINICAL DATA:  Leukocytosis.  No current chest complaints. EXAM: CHEST  2 VIEW  COMPARISON:  Single-view of the chest 06/14/2017. FINDINGS: The lungs are clear. Heart size is normal. No pneumothorax or pleural fluid. No bony abnormality. IMPRESSION: Negative chest. Electronically Signed   By: Inge Rise M.D.   On: 06/21/2017 13:08    Medications: I have reviewed the patient's current medications.  Assessment/Plan:   1. Left sided weakness and functional deficitssecondary to right midbrain infarct, lumbosacral spondylosis with radiculopathy - CIR. Cont ASA 2. DVT proph - Lovenox 3. LBP - cont w/Norco 4. HTN. Cont w/Clonidine, Lopressor, Irbesartan 5. Dyslipidemia - on Lipitor 6. Seizures - on Lamictal 7. Pain in the L thigh - ?MSK vs other. Could be due to Lipitor. Doubt DVT. Will watch    Length of stay, days: [place] , MD 06/23/2017, 8:11 AM

## 2017-06-24 DIAGNOSIS — F4322 Adjustment disorder with anxiety: Secondary | ICD-10-CM

## 2017-06-24 LAB — CBC
HCT: 38.6 % (ref 36.0–46.0)
Hemoglobin: 12.1 g/dL (ref 12.0–15.0)
MCH: 28.7 pg (ref 26.0–34.0)
MCHC: 31.3 g/dL (ref 30.0–36.0)
MCV: 91.5 fL (ref 78.0–100.0)
PLATELETS: 309 10*3/uL (ref 150–400)
RBC: 4.22 MIL/uL (ref 3.87–5.11)
RDW: 14.9 % (ref 11.5–15.5)
WBC: 13.4 10*3/uL — AB (ref 4.0–10.5)

## 2017-06-24 MED ORDER — CLOPIDOGREL BISULFATE 75 MG PO TABS: 75.0000 mg | ORAL_TABLET | Freq: Every day | ORAL | 0 refills | Status: DC

## 2017-06-24 MED ORDER — LAMOTRIGINE 100 MG PO TABS: 200.0000 mg | ORAL_TABLET | Freq: Every day | ORAL | 0 refills | Status: DC

## 2017-06-24 MED ORDER — ATORVASTATIN CALCIUM 80 MG PO TABS: 80.0000 mg | ORAL_TABLET | Freq: Every day | ORAL | 0 refills | Status: DC

## 2017-06-24 MED ORDER — DICLOFENAC SODIUM 1 % TD GEL: 2.0000 g | Freq: Four times a day (QID) | TRANSDERMAL | 0 refills | Status: DC

## 2017-06-24 MED ORDER — IRBESARTAN 75 MG PO TABS: 37.5000 mg | ORAL_TABLET | Freq: Every day | ORAL | 0 refills | Status: DC

## 2017-06-24 MED ORDER — METOPROLOL TARTRATE 25 MG PO TABS: 25.0000 mg | ORAL_TABLET | Freq: Two times a day (BID) | ORAL | 0 refills | Status: DC

## 2017-06-24 MED ORDER — DOCUSATE SODIUM 100 MG PO CAPS: 200.0000 mg | ORAL_CAPSULE | Freq: Every day | ORAL | 0 refills | Status: DC

## 2017-06-24 MED ORDER — ZOLPIDEM TARTRATE 5 MG PO TABS: 5.0000 mg | ORAL_TABLET | Freq: Every evening | ORAL | 0 refills | Status: DC | PRN

## 2017-06-24 MED ORDER — FERROUS SULFATE 325 (65 FE) MG PO TABS: 325.0000 mg | ORAL_TABLET | Freq: Every day | ORAL | 3 refills | Status: DC

## 2017-06-24 NOTE — Progress Notes (Signed)
Patient discharged home with significant other.  Patient left floor ambulatory, escorted by nursing staff and significant other.  Appears to be in no immediate distress at this time.  Patient verbalized understanding of discharge instructions as given by Algis Liming, PA.  All patient belongings sent with patient.  Brita Romp, RN

## 2017-06-24 NOTE — Consult Note (Signed)
Neuropsychological Consultation   Patient:   Leslie Simmons   DOB:   Mar 13, 1966  MR Number:  [id number]  Location:  [facility] [address] [facility] [id number] [place] [address] Dept: [place]: [phone]           Date of Service:   06/24/17  Start Time:   8 AM End Time:   9 AM  Provider/Observer:  Ilean Skill, Psy.D.       Clinical Neuropsychologist       Billing Code/Service: [phone] Units  Chief Complaint:    Leslie Simmons is a 51 year old with history of HTN, hemorrhagic stroke with residual right sided weakness.  Admitted on 06/13/2017 with  Back pain and LLE weakness.  MRI revealed nonhemorrhagic infarct involving lateral right midbrain at right corticospinal tract and remote L-MCA and basal ganglia infarcts.  MRI spin also showed nerve root impingement and stenosis.  The patient has had anxiety and coping issues due to worries about having more strokes and worry about medical issues.  Reason for Service:  Leslie Simmons was referred for neuropsychological consultation due to anxiety and worry.  Below is the full HPI for the current admission.    HPI: Leslie Simmons a 51 y.o.femalewith history fo HTN, hemorrhagic stroke with residual right sided weakness/numbnessand seizure who was admitted on 06/13/17 with reports of back pain and LLE weakness since the night before. MRI brain done revealing acute nonhemorrhagic infarct involving lateral right midbrain at right corticospinal tract and Remote L-MCA and basal ganglia infarcts.MRI lumbar spine done revealing L4 onL5 anterolisthesis with severe canal and bilateral subarticular stenosis and left subarticular Disc protrusion L5-S1 with impingement of S1 nerve. Work up ongoing and NS consulted for input on stenosis. Dr. Ronnald Ramp recommended therapy and elected decompression in the future. Therapy evaluations initiated revealing deficits in gait, ability to ADLs,  lability and mild dysarthria with mild deficits in working memory. CIR recommended by rehab team.  Current Status:  Anxiety and worry about possibility of ongoing medical issues including ongoing stroke risk, back issues.  Behavioral Observation: Leslie Simmons  presents as a 51 y.o.-year-old Right African American Female who appeared her stated age. her dress was Appropriate and she was Well Groomed and her manners were Appropriate to the situation.  her participation was indicative of Appropriate and Attentive behaviors.  There were physical disabilities noted.  she displayed an appropriate level of cooperation and motivation.     Interactions:    Active Appropriate and Attentive  Attention:   abnormal and distracted by internal preoccupations.    Memory:   within normal limits; recent and remote memory intact  Visuo-spatial:  within normal limits  Speech (Volume):  low  Speech:   normal; normal  Thought Process:  Coherent and Relevant  Though Content:  WNL; not suicidal  Orientation:   person, place, time/date and situation  Judgment:   Good  Planning:   Good  Affect:    Anxious  Mood:    Anxious  Insight:   Good  Intelligence:   normal  Medical History:   Past Medical History:  Diagnosis Date  . Hypertension   . Stroke [facility])    Psychiatric History:  History of anxiety  Family Med/Psych History:  Family History  Problem Relation Age of Onset  . Stroke Son     Risk of Suicide/Violence: virtually non-existent   Impression/DX:  Leslie Simmons is a 51 year old with  history of HTN, hemorrhagic stroke with residual right sided weakness.  Admitted on 06/13/2017 with  Back pain and LLE weakness.  MRI revealed nonhemorrhagic infarct involving lateral right midbrain at right corticospinal tract and remote L-MCA and basal ganglia infarcts.  MRI spin also showed nerve root impingement and stenosis.  The patient has had anxiety and coping issues due to worries about having more  strokes and worry about medical issues.  Disposition/Plan:  Patient being discharged today, may follow her on outpatient basis.      Electronically Signed   _______________________ Ilean Skill, Psy.D.

## 2017-06-24 NOTE — Progress Notes (Signed)
[place] PHYSICAL MEDICINE & REHABILITATION     PROGRESS NOTE  Subjective/Complaints:  Pt seen sitting up at EOB this AM speaking with Neuropsych.  She slept well overnight and had a good weekend.  She has several questions.  ROS: Denies CP, SOB, N/V/D.  Objective: Vital Signs: Blood pressure 113/82, pulse 69, temperature 98.1 F (36.7 C), temperature source Oral, resp. rate 18, height 5' (1.524 m), weight 79.3 kg (174 lb 14.4 oz), SpO2 100 %. No results found.  Recent Labs  06/22/17 0414 06/24/17 0437  WBC 11.2* 13.4*  HGB 11.9* 12.1  HCT 37.8 38.6  PLT 326 309   No results for input(s): NA, K, CL, GLUCOSE, BUN, CREATININE, CALCIUM in the last 72 hours.  Invalid input(s): CO CBG (last 3)  No results for input(s): GLUCAP in the last 72 hours.  Wt Readings from Last 3 Encounters:  06/17/17 79.3 kg (174 lb 14.4 oz)  06/13/17 80.5 kg (177 lb 7.5 oz)    Physical Exam:  BP 113/82 (BP Location: Left Arm)   Pulse 69   Temp 98.1 F (36.7 C) (Oral)   Resp 18   Ht 5' (1.524 m)   Wt 79.3 kg (174 lb 14.4 oz)   SpO2 100%   BMI 34.16 kg/m  Constitutional: She appears well-developedand well-nourished. No distress.  HENT: Normocephalicand atraumatic.  Eyes: EOMare normal. No discharge.  Cardiovascular: Regular rate and rhythm. No JVD. Respiratory: Effort normal and breath sounds normal.  GI: Bowel sounds are normal. She exhibits no distension. Musculoskeletal: She exhibits no edemaor tenderness.  Neurological: She is alertand oriented.  Able to follow basic commands.  Has poor insight into deficits.  LUE: 4+/5 proximal to distal  RUE: 4+/5 proximal to distal LLE: 4+/5 proximally, 4+/5 distally (improving) RLE: 4/5 proximally, 4/5 distally (stable) Skin: Skin is warmand dry. She is not diaphoretic.  Psychiatric: She has a normal mood and affect.   Assessment/Plan: 1. Functional deficits secondary to right midbrain infarct, lumbosacral spondylosis with  radiculopathy which require 3+ hours per day of interdisciplinary therapy in a comprehensive inpatient rehab setting. Physiatrist is providing close team supervision and 24 hour management of active medical problems listed below. Physiatrist and rehab team continue to assess barriers to discharge/monitor patient progress toward functional and medical goals.  Function:  Bathing Bathing position   Position: Shower  Bathing parts Body parts bathed by patient: Right arm, Left arm, Chest, Abdomen, Front perineal area, Buttocks, Right upper leg, Left upper leg, Right lower leg, Left lower leg, Back Body parts bathed by helper: Back  Bathing assist Assist Level: More than reasonable time      Upper Body Dressing/Undressing Upper body dressing   What is the patient wearing?: Bra, Pull over shirt/dress Bra - Perfomed by patient: Thread/unthread right bra strap, Thread/unthread left bra strap, Hook/unhook bra (pull down sports bra)   Pull over shirt/dress - Perfomed by patient: Thread/unthread right sleeve, Thread/unthread left sleeve, Put head through opening, Pull shirt over trunk          Upper body assist Assist Level: More than reasonable time      Lower Body Dressing/Undressing Lower body dressing   What is the patient wearing?: Underwear, Pants, Socks, Shoes Underwear - Performed by patient: Thread/unthread right underwear leg, Thread/unthread left underwear leg, Pull underwear up/down   Pants- Performed by patient: Thread/unthread right pants leg, Thread/unthread left pants leg, Pull pants up/down       Socks - Performed by patient: Don/doff right sock, Don/doff  left sock   Shoes - Performed by patient: Don/doff right shoe, Don/doff left shoe, Fasten right, Fasten left            Lower body assist Assist for lower body dressing: More than reasonable time      Toileting Toileting   Toileting steps completed by patient: Adjust clothing prior to toileting, Performs  perineal hygiene, Adjust clothing after toileting Toileting steps completed by helper: Performs perineal hygiene Toileting Assistive Devices: Grab bar or rail  Toileting assist Assist level: More than reasonable time   Transfers Chair/bed transfer   Chair/bed transfer method: Stand pivot Chair/bed transfer assist level: No Help, no cues, assistive device, takes more than a reasonable amount of time Chair/bed transfer assistive device: Armrests, Medical sales representative     Max distance: >250 ft Assist level: No help, No cues, assistive device, takes more than a reasonable amount of time   Wheelchair   Type: Manual Max wheelchair distance: 150 ft Assist Level: Supervision or verbal cues  Cognition Comprehension Comprehension assist level: Follows complex conversation/direction with extra time/assistive device  Expression Expression assist level: Expresses complex ideas: With extra time/assistive device  Social Interaction Social Interaction assist level: Interacts appropriately with others - No medications needed.  Problem Solving Problem solving assist level: Solves basic 90% of the time/requires cueing < 10% of the time  Memory Memory assist level: Recognizes or recalls 90% of the time/requires cueing < 10% of the time     Medical Problem List and Plan: 1. Left sided weakness and functional deficitssecondary to right midbrain infarct, lumbosacral spondylosis with radiculopathy  D/c today  Will see patient for transitional care management in 1-2 weeks.  2. DVT Prophylaxis/Anticoagulation: Pharmaceutical: Lovenox 3. Chronic back pain/Pain Management: Hydrocodone effective.  4. Mood: LCSW to follow for evaluate and support.  5. Neuropsych: This patient is ?fully capable of making decisions on herown behalf. 6. Skin/Wound Care: routine pressure relief measures.  7. Fluids/Electrolytes/Nutrition: Monitor I/Os.   BMP within acceptable range on 9/7 8. HTN: Monitor BP  bid.   Overall controlled 9/10 9. Dyslipidemia: On lipitor.  10. Seizure disorder: On lamictal.  11. H/o recurrent strokes: Hypercoagulopathy panel negative.  12. Chronic insomnia: uses ambien at home.  13. Constipation: resolved with miralax.  14. Prediabetes  Cont to monitor  Controlled 9/7 15. Leukocytosis   Afebrile  Cont to monitor  WBCs 13.4 on 9/10  UA unremarkable, Ucx with multiple species  CXR reviewed, unremarkable  Will need follow up as outpt  >35 minutes spent with >30 minutes in counseling and coordination of care regarding follow up appointments, stroke, previous strokes, back pain/interventions, meds  LOS (Days) 7 A FACE TO FACE EVALUATION WAS PERFORMED  Leslie Simmons Lorie Phenix 06/24/2017 9:56 AM

## 2017-06-24 NOTE — Progress Notes (Signed)
Social Work Discharge Note  The overall goal for the admission was met for:   Discharge location: Yes - home   Length of Stay: Yes - 7 days  Discharge activity level: Yes - modified independent  Home/community participation: Yes  Services provided included: MD, RD, PT, OT, SLP, RN, TR, Pharmacy, Neuropsych and SW  Financial Services: [place] and [place] ([place])  Follow-up services arranged: Home Health: PT/OT from [facility] and Patient/Family has no preference for HH/DME agencies  Comments (or additional information):  Pt is staying with her significant other and her dtr is in [place].  Dtr is helping to manage everything as she is POA.  Pt is mod I with rollator.  CSW got pt in the community clinic so that helps with medication, as pt's medicaid is not able to be billed for anything in [place].  CSW also gave pt/dtr resources on applying for [facility] medicaid, including that pt needs to cancel [place] Medicaid if she stays in [place].  Also gave them info on how to change address with [place].    Patient/Family verbalized understanding of follow-up arrangements: Yes  Individual responsible for coordination of the follow-up plan: pt with her significant other and dtr  Confirmed correct DME delivered: Kennieth Plotts, Silvestre Mesi 06/24/2017    Ryer Asato, Silvestre Mesi

## 2017-06-24 NOTE — Discharge Instructions (Signed)
Inpatient Rehab Discharge Instructions  Allisa Einspahr Discharge date and time:  06/24/17  Activities/Precautions/ Functional Status: Activity: no lifting, driving, or strenuous exercise for till cleared by MD Diet: regular diet Wound Care: none needed   Functional status:  ___ No restrictions     ___ Walk up steps independently ___ 24/7 supervision/assistance   ___ Walk up steps with assistance _X__ Intermittent supervision/assistance  ___ Bathe/dress independently _X__ Walk with walker    _X__ Bathe/dress with assistance ___ Walk Independently    ___ Shower independently ___ Walk with assistance    ___ Shower with assistance _X__ No alcohol     ___ Return to work/school ________  COMMUNITY REFERRALS UPON DISCHARGE:   Home Health:   PT     OT     Agency:  [facility] Phone:  [phone]  Medical Equipment/Items Ordered: Use your rollator and the shower seat that Juanda Crumble got for you.   GENERAL COMMUNITY RESOURCES FOR PATIENT/FAMILY: Support Groups:  [place] Stroke Support Group                             Meets the third Tuesday of each month from 12:15-1:30 PM                             [place]                             For information or to register, call [phone]  Special Instructions:   STROKE/TIA DISCHARGE INSTRUCTIONS SMOKING Cigarette smoking nearly doubles your risk of having a stroke & is the single most alterable risk factor  If you smoke or have smoked in the last 12 months, you are advised to quit smoking for your health.  Most of the excess cardiovascular risk related to smoking disappears within a year of stopping.  Ask you doctor about anti-smoking medications  [place] Quit Line: 1-800-QUIT NOW  Free Smoking Cessation Classes [phone]  CHOLESTEROL Know your levels; limit fat & cholesterol in your diet  Lipid Panel     Component Value Date/Time   CHOL 141  06/14/2017 0534   TRIG 74 06/14/2017 0534   HDL 43 06/14/2017 0534   CHOLHDL 3.3 06/14/2017 0534   VLDL 15 06/14/2017 0534   LDLCALC 83 06/14/2017 0534      Many patients benefit from treatment even if their cholesterol is at goal.  Goal: Total Cholesterol (CHOL) less than 160  Goal:  Triglycerides (TRIG) less than 150  Goal:  HDL greater than 40  Goal:  LDL (LDLCALC) less than 100   BLOOD PRESSURE American Stroke Association blood pressure target is less that 120/80 mm/Hg  Your discharge blood pressure is:  BP: 113/82  Monitor your blood pressure  Limit your salt and alcohol intake  Many individuals will require more than one medication for high blood pressure  DIABETES (A1c is a blood sugar average for last 3 months) Goal HGBA1c is under 7% (HBGA1c is blood sugar average for last 3 months)  Diabetes: No known diagnosis of diabetes    Lab Results  Component Value Date   HGBA1C 5.9 (H)  06/14/2017     Your HGBA1c can be lowered with medications, healthy diet, and exercise.  Check your blood sugar as directed by your physician  Call your physician if you experience unexplained or low blood sugars.  PHYSICAL ACTIVITY/REHABILITATION Goal is 30 minutes at least 4 days per week  Activity: No driving, Therapies: see above Return to work: N/A  Activity decreases your risk of heart attack and stroke and makes your heart stronger.  It helps control your weight and blood pressure; helps you relax and can improve your mood.  Participate in a regular exercise program.  Talk with your doctor about the best form of exercise for you (dancing, walking, swimming, cycling).  DIET/WEIGHT Goal is to maintain a healthy weight  Your discharge diet is: Diet Heart Room service appropriate? Yes; Fluid consistency: Thin liquids Your height is:  Height: 5' (152.4 cm) Your current weight is: Weight: 79.3 kg (174 lb 14.4 oz) Your Body Mass Index (BMI) is:  BMI (Calculated): 34.16  Following  the type of diet specifically designed for you will help prevent another stroke.  Your goal weight is:    Your goal Body Mass Index (BMI) is 19-24.  Healthy food habits can help reduce 3 risk factors for stroke:  High cholesterol, hypertension, and excess weight.  RESOURCES Stroke/Support Group:  Call [phone]   STROKE EDUCATION PROVIDED/REVIEWED AND GIVEN TO PATIENT Stroke warning signs and symptoms How to activate emergency medical system (call 911). Medications prescribed at discharge. Need for follow-up after discharge. Personal risk factors for stroke. Pneumonia vaccine given:  Flu vaccine given:  My questions have been answered, the writing is legible, and I understand these instructions.  I will adhere to these goals & educational materials that have been provided to me after my discharge from the hospital.     My questions have been answered and I understand these instructions. I will adhere to these goals and the provided educational materials after my discharge from the hospital.  Patient/Caregiver Signature _______________________________ Date __________  Clinician Signature _______________________________________ Date __________  Please bring this form and your medication list with you to all your follow-up doctor's appointments.

## 2017-06-25 ENCOUNTER — Telehealth: Payer: Self-pay | Admitting: Physical Medicine and Rehabilitation

## 2017-06-25 LAB — ALPHA GALACTOSIDASE: Alpha galactosidase, serum: 50.2 nmol/hr/mg prt (ref 28.0–80.0)

## 2017-06-25 NOTE — Telephone Encounter (Signed)
Late note: Patient's daughter called for refill on Ambien which she has been on for chronic insomnia.   We discussed alternatives --tylenol PM as well as melatonin.  She did report that patient has been out of it for past 4-5 weeks due to multiple travels and would like it refilled as her MD is OOT. Informed her that will write for 10 pills--boyfriend will have to come in to pick up Rx. Further refills will need to come from primary MD

## 2017-06-27 ENCOUNTER — Other Ambulatory Visit: Payer: Self-pay

## 2017-06-27 NOTE — Patient Outreach (Signed)
[facility]) Care Management  06/27/2017  Leslie Simmons 1966-08-22 [id number]   EMMI: Stroke Referral date: 06/27/17 Referral source: EMMI stroke red alert Referral reason: Questions/ problems with meds?:  YES Day # 1  Telephone call to patient regarding EMMI stroke red alert. HIPAA verified with patient. Discussed EMMI stroke program with patient.  Patient states she did not get pain medicine for her back. Patient states she had a MRI in the hospital which showed she had back issues. Patient states she had had some pain in her back since being discharged. Patient states she is visiting from [place]. Patient states she has a  primary MD, Dr. Lanelle Bal.  Patient states she has an appointment scheduled with Dr. Erlinda Hong on 08/14/17 and Dr. Posey Pronto on 07/04/17.  Patient reports she is taking her medication as prescribed. Patient states she has transportation to her appointment and has a good support system.  RNCM discussed signs/ symptoms of stroke with patient. Advised to call 911 for stroke like symptoms. RNCM advised patient of the side effects of plavix. Advised to be aware of bleeding.  Call doctor for symptoms.  RNCM advised patient to contact her primary MD office and notify them of her admission.  Request medical records to be sent to her primary MD from home her hospital admission. Patient verbalized understanding and appreciation.  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  Quinn Plowman RN,BSN,CCM [facility] Telephonic  [phone]

## 2017-07-03 ENCOUNTER — Encounter: Payer: Medicare Other | Admitting: Physical Medicine & Rehabilitation

## 2017-07-03 ENCOUNTER — Encounter: Payer: Self-pay | Admitting: Physical Medicine & Rehabilitation

## 2017-07-03 ENCOUNTER — Encounter: Payer: Medicare Other | Attending: Physical Medicine & Rehabilitation | Admitting: Physical Medicine & Rehabilitation

## 2017-07-03 VITALS — BP 133/90 | HR 69

## 2017-07-03 DIAGNOSIS — R569 Unspecified convulsions: Secondary | ICD-10-CM | POA: Diagnosis not present

## 2017-07-03 DIAGNOSIS — M4727 Other spondylosis with radiculopathy, lumbosacral region: Secondary | ICD-10-CM | POA: Insufficient documentation

## 2017-07-03 DIAGNOSIS — I10 Essential (primary) hypertension: Secondary | ICD-10-CM | POA: Diagnosis not present

## 2017-07-03 DIAGNOSIS — G8929 Other chronic pain: Secondary | ICD-10-CM | POA: Insufficient documentation

## 2017-07-03 DIAGNOSIS — M48061 Spinal stenosis, lumbar region without neurogenic claudication: Secondary | ICD-10-CM | POA: Diagnosis not present

## 2017-07-03 DIAGNOSIS — R29898 Other symptoms and signs involving the musculoskeletal system: Secondary | ICD-10-CM | POA: Diagnosis not present

## 2017-07-03 DIAGNOSIS — D72829 Elevated white blood cell count, unspecified: Secondary | ICD-10-CM | POA: Diagnosis not present

## 2017-07-03 DIAGNOSIS — M5440 Lumbago with sciatica, unspecified side: Secondary | ICD-10-CM

## 2017-07-03 DIAGNOSIS — R269 Unspecified abnormalities of gait and mobility: Secondary | ICD-10-CM | POA: Diagnosis not present

## 2017-07-03 DIAGNOSIS — G40909 Epilepsy, unspecified, not intractable, without status epilepticus: Secondary | ICD-10-CM | POA: Diagnosis not present

## 2017-07-03 DIAGNOSIS — I63431 Cerebral infarction due to embolism of right posterior cerebral artery: Secondary | ICD-10-CM | POA: Diagnosis not present

## 2017-07-03 DIAGNOSIS — I69351 Hemiplegia and hemiparesis following cerebral infarction affecting right dominant side: Secondary | ICD-10-CM | POA: Diagnosis not present

## 2017-07-03 NOTE — Progress Notes (Signed)
Subjective:    Patient ID: Leslie Simmons, female    DOB: 02-Jun-1966, 51 y.o.   MRN: [id number]  HPI 51 y.o. female with history fo HTN, hemorrhagic stroke with residual right sided weakness/numbness and seizure who presents for hospital followup after receiving CIR for right midbrain infarct, lumbosacral spondylosis with radiculopathy.  Admit date: 06/17/2017 Discharge date: 06/25/2017  At discharge, she was instructed to follow up with Dr. Erlinda Hong, with whom she has an appointment.  She is following up with Ortho regarding potential intervention for back.  Her pain is relatively controlled. BP is controlled.  Denies seizures. She has not had any lab work since being discharge. Denies falls.   Therapies: 3/week. DME: None required Mobility: No assistive device needed.   Pain Inventory Average Pain 8 Pain Right Now 7 My pain is aching  In the last 24 hours, has pain interfered with the following? General activity 8 Relation with others 8 Enjoyment of life 8 What TIME of day is your pain at its worst? morning Sleep (in general) NA  Pain is worse with: bending and standing Pain improves with: medication Relief from Meds: 5  Mobility walk without assistance how many minutes can you walk? 20-30 ability to climb steps?  yes do you drive?  no  Function disabled: date disabled 2012 I need assistance with the following:  household duties  Neuro/Psych numbness tingling spasms confusion  Prior Studies Any changes since last visit?  no  Physicians involved in your care Any changes since last visit?  no   Family History  Problem Relation Age of Onset  . Stroke Son    Social History   Social History  . Marital status: Single    Spouse name: N/A  . Number of children: N/A  . Years of education: N/A   Social History Main Topics  . Smoking status: Never Smoker  . Smokeless tobacco: Never Used  . Alcohol use No  . Drug use: No  . Sexual activity: Not Asked   Other Topics  Concern  . None   Social History Narrative  . None   No past surgical history on file. Past Medical History:  Diagnosis Date  . Hypertension   . Stroke ([place])    BP 133/90   Pulse 69   SpO2 97%   Opioid Risk Score:   Fall Risk Score:  `1  Depression screen 0  Depression screen [facility] 2/9 07/03/2017  Decreased Interest 0  Down, Depressed, Hopeless 0  PHQ - 2 Score 0  Altered sleeping 0  Tired, decreased energy 0  Change in appetite 0  Feeling bad or failure about yourself  0  Trouble concentrating 0  Moving slowly or fidgety/restless 0  Suicidal thoughts 0  PHQ-9 Score 0    Review of Systems  Constitutional: Positive for diaphoresis.  HENT: Negative.   Eyes: Negative.   Respiratory: Negative.   Cardiovascular: Negative.   Gastrointestinal: Negative.   Endocrine: Negative.   Genitourinary: Negative.   Musculoskeletal:       Spasms  Skin: Negative.   Allergic/Immunologic: Negative.   Neurological: Positive for numbness.       Tingling  Hematological: Bruises/bleeds easily.       Plavix  Psychiatric/Behavioral: Positive for confusion.  All other systems reviewed and are negative.      Objective:   Physical Exam Constitutional: She appears well-developed and well-nourished. No distress.  HENT: Normocephalic and atraumatic.  Eyes: EOM are normal. No discharge.  Cardiovascular: RRR.  No JVD. Respiratory: Effort normal and breath sounds normal.  GI: Bowel sounds are normal. She exhibits no distension. Musculoskeletal: She exhibits no edema or tenderness.  Gait: slightly wide based, but otherwise WNL including heel/toe Neurological: She is alert and oriented.  Able to follow basic commands.  Has poor insight into deficits.    LUE: 5/5 proximal to distal  RUE: 5/5 proximal to distal RLE: 4+/5 proximally, 4+/5 distally  LLE: 4+/5 proximally, 4+/5 distally (weaker than right) Skin: Skin is warm and dry. She is not diaphoretic.  Psychiatric: She has a normal  mood and affect.     Assessment & Plan:  51 y.o. female with history fo HTN, hemorrhagic stroke with residual right sided weakness/numbness and seizure who presents for hospital followup after receiving CIR for right midbrain infarct, lumbosacral spondylosis with radiculopathy.  1. Left sided weakness and functional deficits secondary to right midbrain infarct, lumbosacral spondylosis with radiculopathy  Cont therapies  Follow up Neurology  2. Chronic back pain with radiculopathy  Cont Tramdol per Ortho  Cont follow up with Ortho - ?surgery in future  3. Seizure disorder  Cont meds  4. Leukocytosis   Afebrile  Cont to monitor             WBCs 13.4 on 9/10  Labs ordered for today  5. Gait abnormality  Cont therapies

## 2017-07-04 ENCOUNTER — Inpatient Hospital Stay: Payer: Medicare Other

## 2017-07-04 LAB — CBC WITH DIFFERENTIAL/PLATELET
BASOS ABS: 0.1 10*3/uL (ref 0.0–0.2)
Basos: 0 %
EOS (ABSOLUTE): 0.5 10*3/uL — AB (ref 0.0–0.4)
Eos: 4 %
HEMOGLOBIN: 12.8 g/dL (ref 11.1–15.9)
Hematocrit: 38.7 % (ref 34.0–46.6)
Immature Grans (Abs): 0 10*3/uL (ref 0.0–0.1)
Immature Granulocytes: 0 %
LYMPHS ABS: 3.7 10*3/uL — AB (ref 0.7–3.1)
Lymphs: 28 %
MCH: 29.9 pg (ref 26.6–33.0)
MCHC: 33.1 g/dL (ref 31.5–35.7)
MCV: 90 fL (ref 79–97)
MONOCYTES: 8 %
MONOS ABS: 1.1 10*3/uL — AB (ref 0.1–0.9)
NEUTROS ABS: 7.9 10*3/uL — AB (ref 1.4–7.0)
Neutrophils: 60 %
Platelets: 372 10*3/uL (ref 150–379)
RBC: 4.28 x10E6/uL (ref 3.77–5.28)
RDW: 15.4 % (ref 12.3–15.4)
WBC: 13.3 10*3/uL — AB (ref 3.4–10.8)

## 2017-07-26 ENCOUNTER — Inpatient Hospital Stay: Payer: Medicare Other

## 2017-08-07 NOTE — Progress Notes (Deleted)
Patient ID: Leslie Simmons, female   DOB: November 16, 1965, 51 y.o.   MRN: [id number] after being hospitalized 8/30-06/17/2017 for stroke.  Hospital summary: 51 y.o.femalewith medical history significant for hypertension, 2 hemorrhagic strokes with residual right-sided motor deficits, and history of seizures and other comorbids now presenting to the emergency department for evaluation of left leg weakness and low back pain. Patient reports that she had been in her usual state of health until last night when she noted the development of weakness involving the left leg. She reports that there is also some discomfort in the leg, but mainly in her low back. She has had the low back pain previously, but seems to be worsening. She has persistent right-sided weakness from her old strokes, but the left leg weakness is new as of last night. She denies any fall or trauma, denies saddle anesthesia or incontinence, and denies chest pain or palpitations. There is no headache, change in vision or hearing, or confusion.  MRI brain was performed and reveals a 1 cm acute ischemic nonhemorrhagic infarct involving the lateral right mid brain at the level of the cortical spinal tract. There is no mass effect on MRI. MRI lumbar spine was also performed and notable for severe canal stenosis at L4-5 and moderate stenosis at L3-4. Patient was treated with 360milligram aspirin in the emergency department and neurosurgery was consulted by the ED physician. Neurosurgery evaluated patient and recommends Neurology consultation, suspecting that the leg weakness is related to the brain lesion. Neurology has been consulted and requests a medical admission. Patient was admitted for new left leg weakness with acute ischemic stroke on MRI brain and severe lumbar canal stenosis on MRI L-spine. Stroke worked up and now patient being transferred to CIR at the recommendation of PT/OT.   Discharge Diagnoses:  Principal Problem:   Acute ischemic stroke  [facility]) Active Problems:   Lumbar spinal stenosis   Hypertensive urgency   Essential hypertension   History of hemorrhagic stroke with residual hemiparesis (HCC)   History of seizure   Left leg weakness   History of stroke   Hyperlipidemia   Constipation  1. Acute Ischemic CVA of the Lateral Midbrain  - Pt presents with left leg weakness that began the night prior  - Head CT showed No evidence of acute intracranial abnormality. Chronic left parietal and basal ganglia infarcts. - MRI brain showed 1 cm acute ischemic nonhemorrhagic infarct involving the lateral right midbrain at the level the right corticospinal tract. No associated mass effect. Remote posterior left MCA territory and left basal ganglia infarcts as above. - MRA Head w/o Contrast showed Occluded RIGHT posterior cerebral artery at P2 origin, mild collateralization. Occluded LEFT middle cerebral artery at M2 origin with poor Collateralization. Severe stenosis proximal RIGHT M2 segment and proximal LEFT P3 Segment. Focal irregularity and probable stenosis RIGHT supraclinoid internal carotid artery. 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 - CTA Head showed Severe stenosis RIGHT internal carotid artery at anterior genu/supraclinoid segment due to advanced atherosclerosis. 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 Occluded RIGHT PCA at proximal P2 segment, poor collateralization and, likely acute given recent MRI findings. Severe stenosis RIGHT M2 and LEFT P3 segments. Moderate stenosis RIGHT M1 segment. 2 mm RIGHT PCOM origin aneurysm. - CTA Neck showed Atherosclerosis without hemodynamically significant stenosis or acute vascular process in the neck. Suspected cardiomegaly and pulmonary edema. Degenerative cervical spine resulting in severe canal stenosis C5-6. Moderate to severe C5-6 and C6-7 neural foraminal narrowing - ECHOCardiogram and  showed The cavity size was normal. Wall thickness was increased in a  pattern of mild LVH. Systolic function was vigorous. The estimated ejection fraction was in the range of 65% to 70%. - Neurology is consulted and appreciated Recc's  - Plan to continue cardiac monitoring - Lipid Panel showed Cholesterol of 141, HDL of 43, LDL of 83, TG of 74, VLDL 15 - Hemoglobin A1c was 5.9 - Hold antihypertensives and treat with prn agents if SBP >210  - C/w ASA 325 mg po Daily, Clopidogrel 75 mg po daily and Atorvastatin 80 mg po Daily - Obtain PT/OT/SLP - Neurology ordering Hypercoaguable Workup  - PT Eval recommending CIR; OT recommending CIR - CIR consulted and patient a candidate and now awaiting Bed Placement   2. Low Back Pain and Lumbar Spinal Stenosis - MRI L-spine with severe canal stenosis at L4-5, moderate stenosis at L3-4, and herniated disc at L5-S1  - Neurosurgery is consulting and much appreciated, suspects LLE weakness secondary to the acute stroke  - No change in bowel or bladder function, and no saddle anesthesia - Continue supportive care with prn analgesia  - Neurosurgery recommending Therapy for Strengthening and Ambulation - Will need Elective Decompression and Instrument Fusion at L4-5 with possible decompression at L3-4 and L5-S1 once over acute CVA - Follow up with Neurosurgery as an outpatient   3. Hypertension with hypertensive urgency  - BP elevated to 175/105 in ED - Likely secondary to acute ischemic CVA  - Managed at home with Lopressor, Valsartan, and HCTZ  - Plan to hold her home antihypertensives in acute-phase of stroke, treat as needed for SBP >210 ; Metoprolol 100 mg po BID started back today - C/w Labetalol 5 mg IV q2hprn for SBP >210 - Allow for permissive HTN and BP now 128/83 - Defer to CIR Physician to restart Valsartan and HCTZ  4. Seizure Hx  - Continue Lamotrigine 200 mg po Daily  5. ?Cardiomegaly and Pulmonary Edema -Seen on CTA of Neck -CXR this AM showed The heart size and mediastinal contours are within normal  limits. Both lungs are clear. The visualized skeletal structures are unremarkable. -Patient undergoing ECHOCardiogram and showed - Left ventricle: The cavity size was normal. Wall thickness was increased in a pattern of mild LVH. Systolic function wasvigorous. The estimated ejection fraction was in the range of 65% to 70%. -Repeat CXR in AM   6. Leukocytosis -Was likely reactive and now resolved. WBC went from 12.2 -> 12.1 -> 9.9 -Did not Repeat CBC today but can repeat in CIR  7. Constipation, improved -Patient had not had a bowel movement in 4 days but improved -C/w Docusate 100 mg po Daily and Senna-Docusate 1 tab po qHSprn -Added Miralax 17 grams po BID -Given Bisacodyl Suppository 10 mg RC once   8. Insomnia -C/w Ambien   9. 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 Past admission physiatrist, therapy team and rehab RN have worked together to provide customized collaborative inpatient rehab.  Po intake has been good and she is continent of bowel and bladder. Follow up CBC showed persistent leucocytosis but not fever or other signs of infection noted. UA/UCS checked on 9/8 and was negative for infection. CXR showed lungs to be clear.  Check of lytes showed electrolytes and renal status to be WNL. Blood pressures were monitored on bid basis and were noted to be trending upwards. Low dose metoprolol was resumed to help manage tachycardia. Avapro 37.5 mg was added and blood pressures have shown good control on current regimen. Anxiety levels have improved with ego support provided by team. She has tolerated increase in activity and back pain has improved. She is currently requiring hydrocodone once a day to tolerate  therapy and this was discontinued at discharge.  LLE strength is improving with improvement in balance and mobility. Mood is stable and chronic insomnia as been managed with decrease in dose of Ambien. She has made good progress during her rehab stay and is modified independent at discharge. She has been educated on importance of maintaining HEP and will continue to receive follow up HHPT and [facility] by [facility] after discharge.     Rehab course: During patient's stay in rehab team conference was held to monitor patient's progress, set goals and discuss barriers to discharge. At admission, patient required min assist with mobility and basic self-care tasks. Cognitive evaluation revealed mild baseline cognitive deficits with decreased recall but good awareness and ability to compensate for these deficits therefore no ST needed during her stay. She has had improvement in activity tolerance, balance, postural control, as well as ability to compensate for deficits. She is able to complete ADL tasks as modified independent levels. She is modified independent for transfers and is ambulating > 500' at modified independent level. With fatigue, her gait speed decreases with decreased stance on LLE. Berg Balance score has improved to 54/56. Family education was completed regarding all aspects of care and boyfriend to assist as needed after discharge.

## 2017-08-08 ENCOUNTER — Inpatient Hospital Stay: Payer: Medicare Other

## 2017-08-14 ENCOUNTER — Telehealth: Payer: Self-pay

## 2017-08-14 ENCOUNTER — Encounter: Payer: Self-pay | Admitting: Neurology

## 2017-08-14 ENCOUNTER — Encounter (INDEPENDENT_AMBULATORY_CARE_PROVIDER_SITE_OTHER): Payer: Self-pay

## 2017-08-14 ENCOUNTER — Ambulatory Visit (INDEPENDENT_AMBULATORY_CARE_PROVIDER_SITE_OTHER): Payer: Medicare Other | Admitting: Neurology

## 2017-08-14 VITALS — BP 149/99 | HR 73 | Ht 60.0 in | Wt 168.6 lb

## 2017-08-14 DIAGNOSIS — R569 Unspecified convulsions: Secondary | ICD-10-CM | POA: Diagnosis not present

## 2017-08-14 DIAGNOSIS — I6302 Cerebral infarction due to thrombosis of basilar artery: Secondary | ICD-10-CM | POA: Diagnosis not present

## 2017-08-14 DIAGNOSIS — I10 Essential (primary) hypertension: Secondary | ICD-10-CM

## 2017-08-14 DIAGNOSIS — Z8673 Personal history of transient ischemic attack (TIA), and cerebral infarction without residual deficits: Secondary | ICD-10-CM | POA: Diagnosis not present

## 2017-08-14 DIAGNOSIS — E6609 Other obesity due to excess calories: Secondary | ICD-10-CM

## 2017-08-14 DIAGNOSIS — Z6831 Body mass index (BMI) 31.0-31.9, adult: Secondary | ICD-10-CM | POA: Diagnosis not present

## 2017-08-14 MED ORDER — CLOPIDOGREL BISULFATE 75 MG PO TABS: 75.0000 mg | ORAL_TABLET | Freq: Every day | ORAL | 2 refills | Status: AC

## 2017-08-14 MED ORDER — IRBESARTAN 75 MG PO TABS: 37.5000 mg | ORAL_TABLET | Freq: Every day | ORAL | 2 refills | Status: AC

## 2017-08-14 MED ORDER — ATORVASTATIN CALCIUM 80 MG PO TABS: 80.0000 mg | ORAL_TABLET | Freq: Every day | ORAL | 2 refills | Status: AC

## 2017-08-14 MED ORDER — ASPIRIN 325 MG PO TBEC: 325.0000 mg | DELAYED_RELEASE_TABLET | Freq: Every day | ORAL | 2 refills | Status: AC

## 2017-08-14 MED ORDER — LAMOTRIGINE 100 MG PO TABS: 200.0000 mg | ORAL_TABLET | Freq: Every day | ORAL | 2 refills | Status: AC

## 2017-08-14 MED ORDER — GABAPENTIN 300 MG PO CAPS: 300.0000 mg | ORAL_CAPSULE | Freq: Two times a day (BID) | ORAL | 2 refills | Status: DC

## 2017-08-14 MED ORDER — METOPROLOL TARTRATE 25 MG PO TABS: 25.0000 mg | ORAL_TABLET | Freq: Two times a day (BID) | ORAL | 2 refills | Status: AC

## 2017-08-14 NOTE — Progress Notes (Signed)
STROKE NEUROLOGY FOLLOW UP NOTE  NAME: Leslie Simmons DOB: 1966-03-31  REASON FOR VISIT: stroke follow up HISTORY FROM: pt and chart  Today we had the pleasure of seeing Leslie Simmons in follow-up at our Neurology Clinic. Pt was accompanied by husband.   History Summary Leslie Simmons is a 51 y.o. female with history of hypertension, seizure, prior Left MCA stroke withresidual right-sided hemiparesis admitted on 06/13/17 for left leg weakness and back pain. Leslie Simmons did not receive IV t-PA due to arriving outside of the tPA treatment window.  MRI showed small right midbrain infarct at the level of the right corticospinal tract.    MRA as well as CTA head and neck showed occluded left M2 and right P2 segment, severe stenosis of the right ICA siphon and left M1.  EF 65-70%.  Hypercoagulable and autoimmune workup negative.  LDL 83 and A1c 5.3.  UDS negative. Location consistent with small vessel disease.  However, lack of a significant risk factor except obesity and history of stroke in 2012 and 2013 for chronic left MCA cortical infarcts.  Patient was discharged to CIR with dual antiplatelet and high-dose Lipitor.  Interval History During the interval time, the patient has been doing well.  No recurrent strokelike symptoms, no significant neuro residual deficit.  Plan to go back to [place] soon.  BP at home 120s/80s however today, in clinic 149/99.  Continued on aspirin and Plavix as well as Lipitor without side effect.  Still complaining of back pain in left leg pain on gabapentin.  On the Lamictal for seizure control.  REVIEW OF SYSTEMS: Full 14 system review of systems performed and notable only for those listed below and in HPI above, all others are negative:  Constitutional: Excessive sweating Cardiovascular: Leg swelling Ear/Nose/Throat:   Skin:  Eyes:   Respiratory:   Gastroitestinal:   Genitourinary:  Hematology/Lymphatic: Anemia Endocrine:  Musculoskeletal: Back pain, aching muscles, muscle  cramps, walking difficulty Allergy/Immunology:   Neurological: Numbness and seizure Psychiatric:  Sleep: Snoring  The following represents the patient's updated allergies and side effects list: No Known Allergies  The neurologically relevant items on the patient's problem list were reviewed on today's visit.  Neurologic Examination  A problem focused neurological exam (12 or more points of the single system neurologic examination, vital signs counts as 1 point, cranial nerves count for 8 points) was performed.  Blood pressure (!) 149/99, pulse 73, height 5' (1.524 m), weight 168 lb 9.6 oz (76.5 kg).  General - Well nourished, well developed, in no apparent distress.  Ophthalmologic - Sharp disc margins OU.  Cardiovascular - Regular rate and rhythm with no murmur.  Mental Status -  Level of arousal and orientation to time, place, and person were intact. Language including expression, naming, repetition, comprehension was assessed and found intact. Attention span and concentration were normal. Fund of Knowledge was assessed and was intact.  Cranial Nerves II - XII - II - Visual field intact OU. III, IV, VI - Extraocular movements intact. V - Facial sensation intact bilaterally. VII - Facial movement intact bilaterally. VIII - Hearing & vestibular intact bilaterally. X - Palate elevates symmetrically. XI - Chin turning & shoulder shrug intact bilaterally. XII - Tongue protrusion intact.  Motor Strength - The patient's strength was normal in all extremities except left hand dexterity difficulty and pronator drift was absent.  Bulk was normal and fasciculations were absent.   Motor Tone - Muscle tone was assessed at the neck and appendages and was  normal.  Reflexes - The patient's reflexes were 1+ in all extremities and Leslie Simmons had no pathological reflexes.  Sensory - Light touch, temperature/pinprick, vibration and proprioception, and Romberg testing were assessed and were normal.     Coordination - The patient had normal movements in the hands and feet with no ataxia or dysmetria.  Tremor was absent.  Gait and Station - The patient's transfers, posture, gait, station, and turns were observed as normal.   Functional score  mRS = 1   0 - No symptoms.   1 - No significant disability. Able to carry out all usual activities, despite some symptoms.   2 - Slight disability. Able to look after own affairs without assistance, but unable to carry out all previous activities.   3 - Moderate disability. Requires some help, but able to walk unassisted.   4 - Moderately severe disability. Unable to attend to own bodily needs without assistance, and unable to walk unassisted.   5 - Severe disability. Requires constant nursing care and attention, bedridden, incontinent.   6 - Dead.   NIH Stroke Scale = 0   Data reviewed: I personally reviewed the images and agree with the radiology interpretations.  Ct Head Wo Contrast 06/13/2017 IMPRESSION: 1. No evidence of acute intracranial abnormality. 2. Chronic left parietal and basal ganglia infarcts.  Ct Angio Head W Or Wo Contrast Ct Angio Neck W Or Wo Contrast 06/14/2017 IMPRESSION: CTA NECK: 1. Atherosclerosis without hemodynamically significant stenosis or acute vascular process in the neck. 2. Suspected cardiomegaly and pulmonary edema. Recommend chest radiograph. 3. Degenerative cervical spine resulting in severe canal stenosis C5-6. Moderate to severe C5-6 and C6-7 neural foraminal narrowing. CTA HEAD: 1. Severe stenosis RIGHT internal carotid artery at anterior genu/supraclinoid segment due to advanced atherosclerosis. 2. 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 3. Occluded RIGHT PCA at proximal P2 segment, poor collateralization and, likely acute given recent MRI findings. 4. Severe stenosis RIGHT M2 and LEFT P3 segments. Moderate stenosis RIGHT M1 segment. 5. 2 mm RIGHT PCOM origin aneurysm. Aortic  Atherosclerosis (ICD10-I70.0).   Mr Brain Wo Contrast (neuro Protocol) 06/13/2017 IMPRESSION: 1. 1 cm acute ischemic nonhemorrhagic infarct involving the lateral right midbrain at the level the right corticospinal tract. No associated mass effect. 2. Remote posterior left MCA territory and left basal ganglia infarcts as above.   Mr Lumbar Spine Wo Contrast 06/13/2017 IMPRESSION: 1. 4 mm anterolisthesis of L4 on L5 with associated disc bulge and moderate facet arthropathy, resulting in severe canal and bilateral subarticular stenosis, with moderate bilateral L4 foraminal narrowing. 2. Left subarticular disc protrusion at L5-S1, impinging upon the descending left S1 nerve root in the left lateral recess. 3. Left foraminal/extraforaminal disc protrusion at L2-3, contacting and potentially irritating the exiting left L2 nerve root. 4. Diffuse congenital shortening of pedicles. 5. Fibroid uterus.   Mr Jodene Nam Head Wo Contrast 06/13/2017 IMPRESSION: 1. 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 2. Occluded LEFT middle cerebral artery at M2 origin with poor collateralization. 3. Severe stenosis proximal RIGHT M2 segment and proximal LEFT P3 segment. 4. Focal irregularity and probable stenosis RIGHT supraclinoid internal carotid artery. 5. 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 6. Recommend CTA for further characterization.  Dg Chest Port 1 View 06/14/2017 IMPRESSION: No active disease.  TTE 06/14/2017 - Left ventricle: The cavity size was normal. Wall thickness was increased in a pattern of mild LVH. Systolic function was vigorous. The estimated ejection fraction was in the  range of 65% to 70%.  Component     Latest Ref Rng & Units 06/14/2017  Cholesterol     0 - 200 mg/dL 141  Triglycerides     <150 mg/dL 74  HDL Cholesterol     >40 mg/dL 43  Total CHOL/HDL Ratio     RATIO 3.3  VLDL     0 - 40 mg/dL 15  LDL (calc)     0 - 99  mg/dL 83  Alpha galactosidase, serum     28.0 - 80.0 nmol/hr/mg prt 50.2  Interpretation      Comment  Director Review      Comment  Methodology      Comment  Cytoplasmic (C-ANCA)     Neg:<1:20 titer <1:20  P-ANCA     Neg:<1:20 titer <1:20  Atypical P-ANCA titer     Neg:<1:20 titer <1:20  PTT Lupus Anticoagulant     0.0 - 51.9 sec 29.1  DRVVT     0.0 - 47.0 sec 33.9  Lupus Anticoag Interp      Comment:  Beta-2 Glycoprotein I Ab, IgG     0 - 20 GPI IgG units <9  Beta-2-Glycoprotein I IgM     0 - 32 GPI IgM units <9  Beta-2-Glycoprotein I IgA     0 - 25 GPI IgA units <9  Anticardiolipin Ab,IgG,Qn     0 - 14 GPL U/mL <9  Anticardiolipin Ab,IgM,Qn     0 - 12 MPL U/mL <9  Anticardiolipin Ab,IgA,Qn     0 - 11 APL U/mL <9  Hemoglobin A1C     4.8 - 5.6 % 5.9 (H)  Mean Plasma Glucose     mg/dL 122.63  Myeloperoxidase Abs     0.0 - 9.0 U/mL <9.0  ANCA Proteinase 3     0.0 - 3.5 U/mL <3.5  HIV Screen 4th Generation wRfx     Non Reactive Non Reactive  ANA Ab, IFA      Negative  ds DNA Ab     0 - 9 IU/mL <1  CRP     <1.0 mg/dL <0.8  Sed Rate     0 - 22 mm/hr 12  RA Latex Turbid.     0.0 - 13.9 IU/mL 13.6  Homocysteine     0.0 - 15.0 umol/L 6.2  Sickle Cell Screen     Negative Negative  ENA SM Ab Ser-aCnc     0.0 - 0.9 AI <0.2    Assessment: As you may recall, Leslie Simmons is a 51 y.o. African American female with PMH of hypertension, seizure, prior Left MCA stroke admitted on 06/13/17 for small right midbrain infarct on MRI. MRA as well as CTA head and neck showed occluded left M2 and right P2 segment, severe stenosis of the right ICA siphon and left M1.  EF 65-70%.  Hypercoagulable and autoimmune workup negative.  LDL 83 and A1c 5.3.  UDS negative. Stroke location consistent with small vessel disease.  However, lack of a significant risk factor except obesity and history of stroke in 2012 and 2013 for chronic left MCA cortical infarcts.  Patient was discharged to CIR with dual  antiplatelet and high-dose Lipitor. During the interval time, no recurrent strokelike symptoms, no significant neuro residual deficit.  Plan to go back to [place] soon.   Plan:  - continue ASA and plavix for another two month and then plavix alone - continue lipitor for stroke prevention - check BP at home and record - Continue  Lamictal for seizure control - Follow up with your primary care physician in [place] for stroke risk factor modification. Recommend maintain blood pressure goal <120-150/70-90, diabetes with hemoglobin A1c goal below 7.0% and lipids with LDL cholesterol goal below 70 mg/dL.  - healthy diet and regular exercise  - OK for air travel back to [place] without restrictions.  - follow up in 4 months or follow up with your neurologist in [place].   I spent more than 25 minutes of face to face time with the patient. Greater than 50% of time was spent in counseling and coordination of care.    No orders of the defined types were placed in this encounter.   Meds ordered this encounter  Medications  . gabapentin (NEURONTIN) 300 MG capsule    Sig: Take 1 capsule (300 mg total) by mouth 2 (two) times daily.    Dispense:  60 capsule    Refill:  2  . aspirin 325 MG EC tablet    Sig: Take 1 tablet (325 mg total) by mouth daily.    Dispense:  30 tablet    Refill:  2  . atorvastatin (LIPITOR) 80 MG tablet    Sig: Take 1 tablet (80 mg total) by mouth daily at 6 PM.    Dispense:  30 tablet    Refill:  2  . clopidogrel (PLAVIX) 75 MG tablet    Sig: Take 1 tablet (75 mg total) by mouth daily.    Dispense:  30 tablet    Refill:  2  . irbesartan (AVAPRO) 75 MG tablet    Sig: Take 0.5 tablets (37.5 mg total) by mouth daily.    Dispense:  30 tablet    Refill:  2  . lamoTRIgine (LAMICTAL) 100 MG tablet    Sig: Take 2 tablets (200 mg total) by mouth daily.    Dispense:  30 tablet    Refill:  2  . metoprolol tartrate (LOPRESSOR) 25 MG tablet    Sig: Take 1 tablet  (25 mg total) by mouth 2 (two) times daily.    Dispense:  60 tablet    Refill:  2    Patient Instructions  - continue ASA and plavix for another two month and then plavix alone - continue lipitor for stroke prevention - check BP at home and record - Continue Lamictal for seizure control - Follow up with your primary care physician in [place] for stroke risk factor modification. Recommend maintain blood pressure goal <120-150/70-90, diabetes with hemoglobin A1c goal below 7.0% and lipids with LDL cholesterol goal below 70 mg/dL.  - healthy diet and regular exercise  - OK for air travel back to [place] without restrictions.  - follow up in 4 months or follow up with your neurologist in [place].     Rosalin Hawking, MD PhD [facility] [address] [place], [place] [address] [phone]

## 2017-08-14 NOTE — Telephone Encounter (Signed)
Patient fill out request authorization for office notes. Sent to medical records. Pt is going to back to [place] where her current neurologist it. Pts friend on DPR will pick up completed office notes on 08/15/2017. Pt is from [place] was here visiting when she had the stroke

## 2017-08-14 NOTE — Patient Instructions (Addendum)
-   continue ASA and plavix for another two month and then plavix alone - continue lipitor for stroke prevention - check BP at home and record - Continue Lamictal for seizure control - Follow up with your primary care physician in [place] for stroke risk factor modification. Recommend maintain blood pressure goal <120-150/70-90, diabetes with hemoglobin A1c goal below 7.0% and lipids with LDL cholesterol goal below 70 mg/dL.  - healthy diet and regular exercise  - OK for air travel back to [place] without restrictions.  - follow up in 4 months or follow up with your neurologist in [place].

## 2017-08-15 ENCOUNTER — Encounter: Payer: Medicare Other | Attending: Physical Medicine & Rehabilitation | Admitting: Physical Medicine & Rehabilitation

## 2017-08-15 DIAGNOSIS — R269 Unspecified abnormalities of gait and mobility: Secondary | ICD-10-CM | POA: Insufficient documentation

## 2017-08-15 DIAGNOSIS — M4727 Other spondylosis with radiculopathy, lumbosacral region: Secondary | ICD-10-CM | POA: Insufficient documentation

## 2017-08-15 DIAGNOSIS — I10 Essential (primary) hypertension: Secondary | ICD-10-CM | POA: Insufficient documentation

## 2017-08-15 DIAGNOSIS — G8929 Other chronic pain: Secondary | ICD-10-CM | POA: Insufficient documentation

## 2017-08-15 DIAGNOSIS — I69351 Hemiplegia and hemiparesis following cerebral infarction affecting right dominant side: Secondary | ICD-10-CM | POA: Insufficient documentation

## 2017-08-15 DIAGNOSIS — G40909 Epilepsy, unspecified, not intractable, without status epilepticus: Secondary | ICD-10-CM | POA: Insufficient documentation

## 2017-08-15 DIAGNOSIS — D72829 Elevated white blood cell count, unspecified: Secondary | ICD-10-CM | POA: Insufficient documentation

## 2017-08-19 ENCOUNTER — Telehealth: Payer: Self-pay

## 2017-08-19 ENCOUNTER — Telehealth: Payer: Self-pay | Admitting: Neurology

## 2017-08-19 NOTE — Telephone Encounter (Signed)
Message sent to Dr Erlinda Hong about pts gabapentin not working.,

## 2017-08-19 NOTE — Telephone Encounter (Signed)
Rn call patient to let her know to increase gabapentin to 300mg  three times a day. Rn stated a new rx can be sent before she runs out of meds. PT verbalized understanding.

## 2017-08-19 NOTE — Telephone Encounter (Signed)
Patient states gabapentin (NEURONTIN) 300 MG capsule is not helping and she is taking twice a day. I advised Dr. Erlinda Hong is working in the hospital this week but she would like a call back from the nurse to discuss.Patient uses Music therapist.

## 2017-08-19 NOTE — Telephone Encounter (Signed)
Please tell the pt to increase gabapentin to 300mg  tid. She can continue her current bottle gabapentin, just take 3 times a day. Once the bottle finished, call us back and we will refill her meds at that time. No need new prescription at this time. Thanks.   Rosalin Hawking, MD PhD Stroke Neurology 08/19/2017 3:02 PM

## 2017-08-19 NOTE — Telephone Encounter (Signed)
Made in error

## 2017-08-20 ENCOUNTER — Other Ambulatory Visit: Payer: Self-pay | Admitting: Physician Assistant

## 2017-08-20 DIAGNOSIS — R102 Pelvic and perineal pain: Secondary | ICD-10-CM

## 2017-08-30 ENCOUNTER — Telehealth: Payer: Self-pay | Admitting: Neurology

## 2017-08-30 MED ORDER — GABAPENTIN 800 MG PO TABS: 800.0000 mg | ORAL_TABLET | Freq: Three times a day (TID) | ORAL | 2 refills | Status: AC

## 2017-08-30 NOTE — Telephone Encounter (Signed)
Pt would like to know if she can get an increase in the mg because gabapentin (NEURONTIN) 300 MG capsule is not working for her, please call

## 2017-08-30 NOTE — Telephone Encounter (Signed)
I called the patient.  The patient is having ongoing back pain and pain down into the left leg posteriorly in the thigh.  The pain is better with lying down, but comes on with sitting or standing.  She feels there may be some slight weakness in the leg.  She is currently on gabapentin taking 900 mg twice daily, she tolerates the medication and it does help slightly but she is still having a lot of pain.  We will go up on the gabapentin taking 800 mg 3 times daily, if this does not help, she may require further workup for the pain, and try another medication such as Cymbalta.

## 2017-08-30 NOTE — Addendum Note (Signed)
Addended by: Kathrynn Ducking on: 08/30/2017 01:24 PM   Modules accepted: Orders

## 2017-12-17 ENCOUNTER — Ambulatory Visit: Payer: Medicare Other | Admitting: Neurology

## 2017-12-18 ENCOUNTER — Encounter: Payer: Self-pay | Admitting: Neurology

## 2018-08-20 IMAGING — CT CT ANGIO HEAD
2 of 8 series · 8 of 33 positions shown · IV contrast (APPLIED)
Comparison: MRI and MRA head June 13, 2017

CLINICAL DATA: Follow-up stroke and cerebral artery occlusion.

EXAM:
CT ANGIOGRAPHY HEAD AND NECK
TECHNIQUE: Multidetector CT imaging of the head and neck was performed using
the standard protocol during bolus administration of intravenous
contrast. Multiplanar CT image reconstructions and MIPs were
obtained to evaluate the vascular anatomy. Carotid stenosis
measurements (when applicable) are obtained utilizing NASCET
criteria, using the distal internal carotid diameter as the
denominator.
CONTRAST:  50 cc Isovue 370

[Series 5: cta neck/head · axial · 0.44mm/px · z∈[-194,-84]mm · 2 of 165 slices shown]
[im 55/165  soft-tissue]
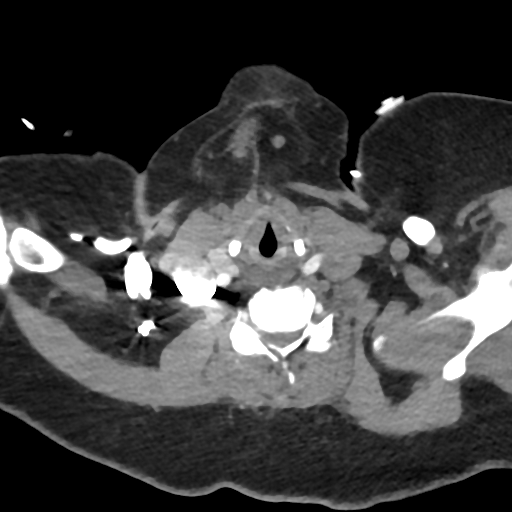
[im 110/165  soft-tissue]
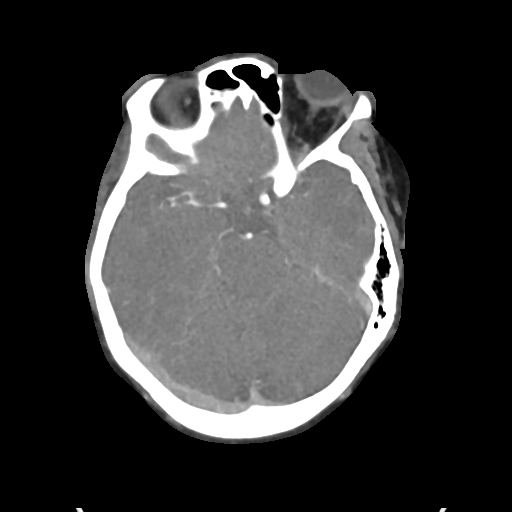

[Series 7: ax thins · axial · 0.39mm/px · z∈[-262,-29]mm · 6 of 327 slices shown]
[im 47/327  soft-tissue]
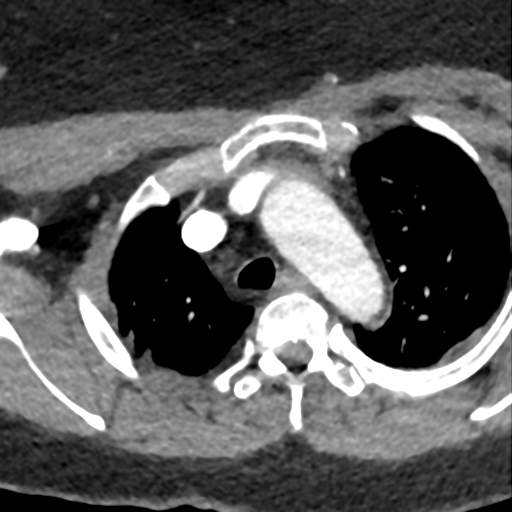
[im 94/327  bone]
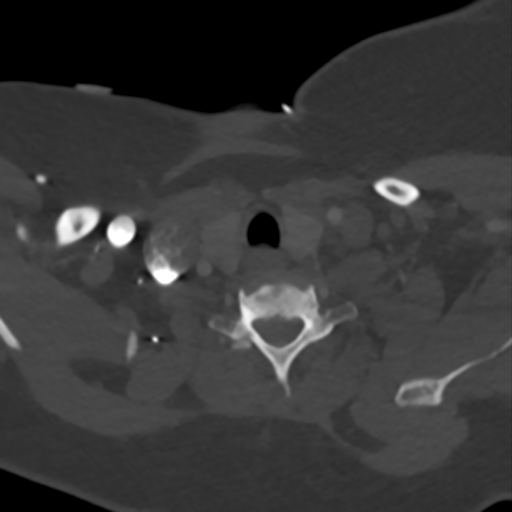
[im 140/327  soft-tissue]
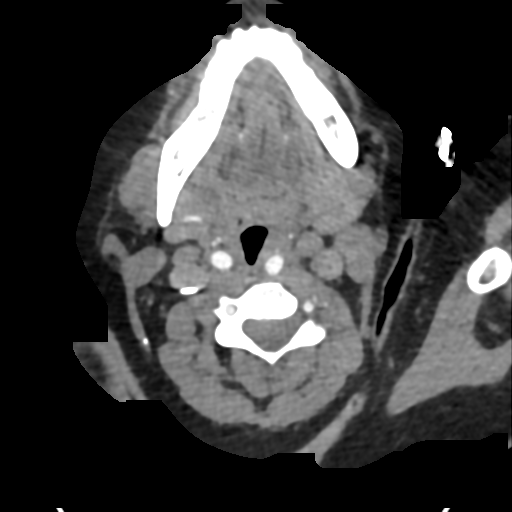
[im 187/327  bone]
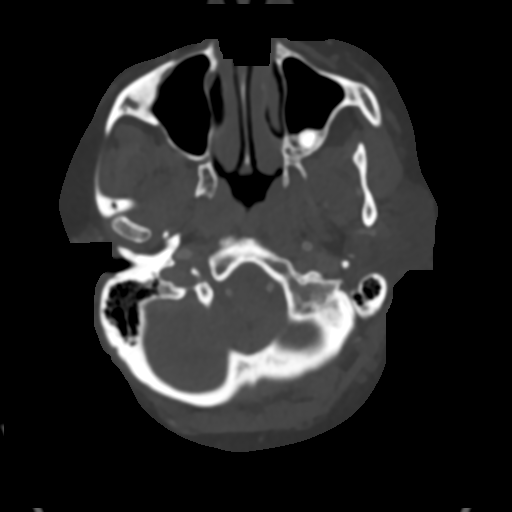
[im 233/327  soft-tissue]
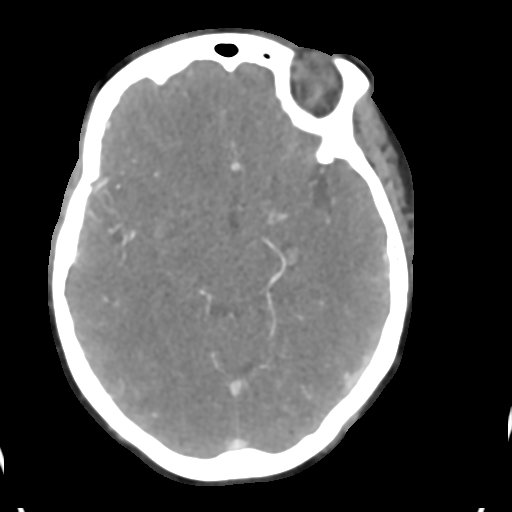
[im 280/327  bone]
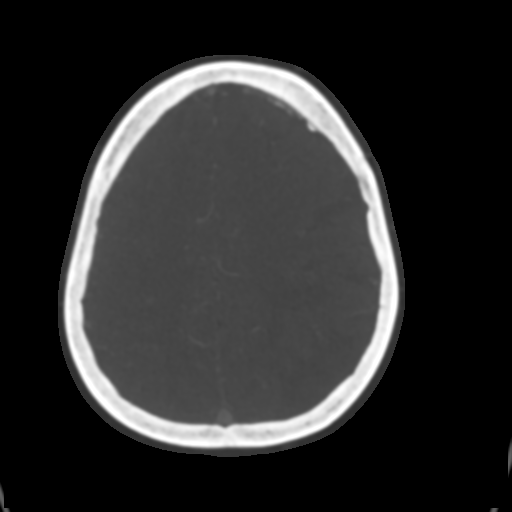

[8 of 33 positions shown; findings below may reference images not displayed]

FINDINGS: CTA NECK

AORTIC ARCH: Normal appearance of the thoracic arch, 2 vessel arch
is a normal variant. Mild calcific atherosclerosis aortic arch.
Intimal thickening resulting in moderate stenosis RIGHT subclavian
artery origin.

RIGHT CAROTID SYSTEM: Common carotid artery is widely patent,
retropharyngeal course. Mild eccentric calcific atherosclerosis
RIGHT carotid bifurcation without hemodynamically significant
stenosis by NASCET criteria. RIGHT tonsillar loop.

LEFT CAROTID SYSTEM: Common carotid artery is widely patent,
coursing in a straight line fashion. Retropharyngeal course. Mild
eccentric intimal thickening without hemodynamically significant
stenosis by NASCET criteria.

VERTEBRAL ARTERIES:Left vertebral artery is dominant. Patent
bilateral vertebral artery's, mild extrinsic deformity due to
degenerative cervical spine.

SKELETON: No acute osseous process though bone windows have not been
submitted.

OTHER NECK: Soft tissues of the neck are nonacute though, not
tailored for evaluation. Reversed cervical lordosis with severe C5-6
and C6-7 degenerative discs, moderate at C4-5. Severe canal stenosis
C5-6. Moderate to severe C5-6 and C6-7 neural foraminal narrowing.

UPPER CHEST: Included lung apices are clear. No superior mediastinal
lymphadenopathy. Mild suspected cardiomegaly with hazy ground-glass
opacities and pulmonary vascular congestion.

CTA HEAD

ANTERIOR CIRCULATION: Calcific atherosclerosis resulting in severe
stenosis RIGHT anterior genu of the internal carotid artery to
paraophthalmic segment. 2 mm inferiorly directed aneurysm at
posterior communicating artery origin. Moderate stenosis LEFT
supraclinoid internal carotid artery. Thready RIGHT A1 segment with
moderate luminal irregularity LEFT A1 segment. Patent anterior
communicating artery. Bilateral anterior cerebral artery's are
patent. Occluded LEFT M2 origin, superior division. Thready inferior
division with poor collateralization in pruning. Moderate stenosis
RIGHT M1 segment. Severe stenosis proximal RIGHT M2 segment.

No large vessel occlusion, significant stenosis, contrast
extravasation or aneurysm.

POSTERIOR CIRCULATION: Patent vertebral arteries, vertebrobasilar
junction and basilar artery, as well as main branch vessels.
Occluded RIGHT proximal P2 segment with mild collateralization.
Severe stenosis LEFT P3 origin.

No large vessel occlusion, significant stenosis, contrast
extravasation or aneurysm.

VENOUS SINUSES: Major dural venous sinuses are patent though not
tailored for evaluation on this angiographic examination.

ANATOMIC VARIANTS: None.

DELAYED PHASE: No abnormal intracranial enhancement.

MIP images reviewed.
IMPRESSION: CTA NECK:

1. Atherosclerosis without hemodynamically significant stenosis or
acute vascular process in the neck.
2. Suspected cardiomegaly and pulmonary edema. Recommend chest
radiograph.
3. Degenerative cervical spine resulting in severe canal stenosis
C5-6. Moderate to severe C5-6 and C6-7 neural foraminal narrowing.
CTA HEAD:

1. Severe stenosis RIGHT internal carotid artery at anterior
genu/supraclinoid segment due to advanced atherosclerosis.
2. Occluded LEFT MCA at M2 origin, likely chronic.
3. Occluded RIGHT PCA at proximal P2 segment, poor collateralization
and, likely acute given recent MRI findings.
4. Severe stenosis RIGHT M2 and LEFT P3 segments. Moderate stenosis
RIGHT M1 segment.
5. 2 mm RIGHT PCOM origin aneurysm.
Aortic Atherosclerosis ([id number]).
